# Patient Record
Sex: Female | Born: 1975 | Hispanic: No | Marital: Married | Smoking: Never smoker
Health system: Southern US, Community
[De-identification: ages and names within clinical notes are randomized; demographics above are authoritative.]

## PROBLEM LIST (undated history)

## (undated) DIAGNOSIS — Z9889 Other specified postprocedural states: Secondary | ICD-10-CM

## (undated) DIAGNOSIS — N820 Vesicovaginal fistula: Secondary | ICD-10-CM

## (undated) HISTORY — DX: Vesicovaginal fistula: N82.0

## (undated) HISTORY — PX: MOLE REMOVAL: SHX2046

## (undated) HISTORY — DX: Other specified postprocedural states: Z98.890

## (undated) HISTORY — PX: VESICOVAGINAL FISTULA REPAIR: SHX320

---

## 2001-01-12 ENCOUNTER — Other Ambulatory Visit: Admission: RE | Admit: 2001-01-12 | Discharge: 2001-01-12 | Payer: Self-pay | Admitting: Gynecology

## 2002-01-11 ENCOUNTER — Other Ambulatory Visit: Admission: RE | Admit: 2002-01-11 | Discharge: 2002-01-11 | Payer: Self-pay | Admitting: Gynecology

## 2003-01-24 ENCOUNTER — Other Ambulatory Visit: Admission: RE | Admit: 2003-01-24 | Discharge: 2003-01-24 | Payer: Self-pay | Admitting: Gynecology

## 2004-01-30 ENCOUNTER — Other Ambulatory Visit: Admission: RE | Admit: 2004-01-30 | Discharge: 2004-01-30 | Payer: Self-pay | Admitting: Gynecology

## 2005-02-04 ENCOUNTER — Other Ambulatory Visit: Admission: RE | Admit: 2005-02-04 | Discharge: 2005-02-04 | Payer: Self-pay | Admitting: Gynecology

## 2005-10-09 ENCOUNTER — Other Ambulatory Visit: Admission: RE | Admit: 2005-10-09 | Discharge: 2005-10-09 | Payer: Self-pay | Admitting: Gynecology

## 2006-04-22 ENCOUNTER — Inpatient Hospital Stay (HOSPITAL_COMMUNITY): Admission: AD | Admit: 2006-04-22 | Discharge: 2006-04-24 | Payer: Self-pay | Admitting: Gynecology

## 2006-06-03 ENCOUNTER — Other Ambulatory Visit: Admission: RE | Admit: 2006-06-03 | Discharge: 2006-06-03 | Payer: Self-pay | Admitting: Gynecology

## 2007-06-05 ENCOUNTER — Other Ambulatory Visit: Admission: RE | Admit: 2007-06-05 | Discharge: 2007-06-05 | Payer: Self-pay | Admitting: Gynecology

## 2008-05-05 ENCOUNTER — Inpatient Hospital Stay (HOSPITAL_COMMUNITY): Admission: AD | Admit: 2008-05-05 | Discharge: 2008-05-09 | Payer: Self-pay | Admitting: Obstetrics & Gynecology

## 2008-05-06 ENCOUNTER — Encounter (INDEPENDENT_AMBULATORY_CARE_PROVIDER_SITE_OTHER): Payer: Self-pay | Admitting: Obstetrics & Gynecology

## 2008-05-17 ENCOUNTER — Ambulatory Visit (HOSPITAL_COMMUNITY): Admission: RE | Admit: 2008-05-17 | Discharge: 2008-05-17 | Payer: Self-pay | Admitting: Urology

## 2008-08-02 ENCOUNTER — Ambulatory Visit (HOSPITAL_COMMUNITY): Admission: RE | Admit: 2008-08-02 | Discharge: 2008-08-03 | Payer: Self-pay | Admitting: Urology

## 2008-08-02 ENCOUNTER — Encounter (INDEPENDENT_AMBULATORY_CARE_PROVIDER_SITE_OTHER): Payer: Self-pay | Admitting: Urology

## 2008-08-23 ENCOUNTER — Ambulatory Visit (HOSPITAL_COMMUNITY): Admission: RE | Admit: 2008-08-23 | Discharge: 2008-08-23 | Payer: Self-pay | Admitting: Urology

## 2008-10-25 ENCOUNTER — Ambulatory Visit: Payer: Self-pay | Admitting: Gynecology

## 2008-10-26 ENCOUNTER — Ambulatory Visit: Payer: Self-pay | Admitting: Gynecology

## 2008-11-24 ENCOUNTER — Ambulatory Visit: Payer: Self-pay | Admitting: Gynecology

## 2009-08-09 ENCOUNTER — Other Ambulatory Visit: Admission: RE | Admit: 2009-08-09 | Discharge: 2009-08-09 | Payer: Self-pay | Admitting: Gynecology

## 2009-08-09 ENCOUNTER — Encounter: Payer: Self-pay | Admitting: Gynecology

## 2009-08-09 ENCOUNTER — Ambulatory Visit: Payer: Self-pay | Admitting: Gynecology

## 2009-08-10 ENCOUNTER — Ambulatory Visit: Payer: Self-pay | Admitting: Gynecology

## 2009-09-13 ENCOUNTER — Ambulatory Visit: Payer: Self-pay | Admitting: Gynecology

## 2010-01-05 ENCOUNTER — Ambulatory Visit: Payer: Self-pay | Admitting: Gynecology

## 2010-02-01 ENCOUNTER — Ambulatory Visit: Payer: Self-pay | Admitting: Gynecology

## 2010-05-08 ENCOUNTER — Ambulatory Visit: Payer: Self-pay | Admitting: Gynecology

## 2010-08-13 ENCOUNTER — Ambulatory Visit: Payer: Self-pay | Admitting: Gynecology

## 2010-08-13 ENCOUNTER — Other Ambulatory Visit: Admission: RE | Admit: 2010-08-13 | Discharge: 2010-08-13 | Payer: Self-pay | Admitting: Gynecology

## 2010-09-26 ENCOUNTER — Ambulatory Visit: Payer: Self-pay | Admitting: Women's Health

## 2011-01-06 ENCOUNTER — Encounter: Payer: Self-pay | Admitting: Urology

## 2011-01-07 ENCOUNTER — Encounter: Payer: Self-pay | Admitting: Urology

## 2011-04-30 NOTE — Consult Note (Signed)
[name], [name]           ACCOUNT NO.:  [id number]   MEDICAL RECORD NO.:  [id number]          PATIENT TYPE:  OUT   LOCATION:  XRAY                         FACILITY:  [facility]   PHYSICIAN:  Courtney Paris, M.D.DATE OF BIRTH:  February 20, 1976   DATE OF CONSULTATION:  05/06/2008  DATE OF DISCHARGE:                                 CONSULTATION   REQUESTING PHYSICIAN:  Dineen Kid. Rana Snare, MD   REASON FOR CONSULTATION:  Left hydronephrosis and bladder injury.   BRIEF HISTORY:  The patient is a 35 year old white female who underwent  a C-section this morning with her second child, a little girl, and  during the C-section, the bladder was inadvertently entered.  This was  closed primarily and postoperatively a CT scan showed a left  hydronephrosis that was rather marked and with delayed function of the  left kidney.  Very little if any dye was getting into the ureter with  the delayed films.   The patient does not have pain in the left flank, just in site of the  Pfannenstiel incision.  She has not had any urinary problems in the  past.   ALLERGIES:  She has no allergies.   MEDICATIONS:  No medications.   REVIEW OF SYSTEMS:  Generally unremarkable for 12 systems.   FAMILY HISTORY AND SOCIAL HISTORY:  Unremarkable.  She has one other  child.   PHYSICAL EXAMINATION:  VITAL SIGNS:  Normal.  GENERAL:  She is a pleasant white female lying quietly in bed.  No acute  distress.  HEENT:  Clear.  NECK:  Supple.  ABDOMEN:  Soft with no CVA tenderness that can be elicited.  She has a  recent Pfannenstiel scar with a dressing over it.  EXTREMITIES:  Negative.  No edema.  Good distal pulses.   The CT scan was reviewed and I agree that there was marked  hydronephrosis of the left kidney with delayed function noted.   The best course would be to do a percutaneous left nephrostomy today at  [facility] through interventional radiology, this has already been  ordered.  With her recent  surgery with the bladder repair and the lack  of ureteroscopic equipment at [facility], a left percutaneous  nephrostomy will be the safest and most conservative at this point.  She  could go home with that and next week also through interventional  radiology, a  antegrade stent placement could be attempted and removal of the  percutaneous nephrostomy at that time.  This would give time for the  injury to hopefully heal and open up and then I could manage the stent  cystoscopically later.   I will be glad to follow the patient with you.      Courtney Paris, M.D.  Electronically Signed     HMK/MEDQ  D:  05/06/2008  T:  05/07/2008  Job:  [id number]

## 2011-04-30 NOTE — Op Note (Signed)
NAME:  [name], [name] NO.:  [id number]   MEDICAL RECORD NO.:  [id number]          PATIENT TYPE:  INP   LOCATION:                                FACILITY:  [facility]   PHYSICIAN:  Freddy Finner, M.D.   DATE OF BIRTH:  1976/11/28   DATE OF PROCEDURE:  DATE OF DISCHARGE:                               OPERATIVE REPORT   PREOPERATIVE DIAGNOSES:  Intrauterine pregnancy at term, spontaneous  onset of labor, intrapartum fetal distress, and failure to progress in  labor with failure of descent in second stage and persistent occipital  posterior presentation.   POSTOPERATIVE DIAGNOSES:  Delivery of viable female infant, Apgars of 8  and 9 signed by NICU and attendance and arterial cord pH of 7.91.   ANESTHESIA:  Epidural which was placed during labor.   ESTIMATED INTRAOPERATIVE BLOOD LOSS:  1000 mL.   INTRAOPERATIVE COMPLICATIONS:  Cystotomy and vaginal laceration, both  adequately repaired.   SURGEON:  Freddy Finner, MD.   ASSISTANT:  Dineen Kid. Rana Snare, MD.   The patient is a 35 year old gravida 2, para 1, who presented in labor.  She progressed to complete pushing for 1-1/2 hours.  Examination at that  time did reveal that the cervix to be completely dilated.  The vertex  was at a -1 station with bulging.  The fetal heart tracing showed deep  variable decelerations with decrease in beat-to-beat variability.  These  decelerations have being on for approximately 45 minutes before  discovered them when I was in the room to check the patient's due to her  failure to progress in stage II of labor.  A decision was then made to  proceed with cesarean, and she was brought to the operating room  quickly.  There, she was placed in dorsal recumbent position.  The  abdomen was prepped and draped in the usual fashion.  Foley was  indwelling.  Sterile drapes were applied.  The lower abdominal  transverse incision was made and carried sharply down to fascia.  Fascia  was entered  sharply and extended to the external skin incision.  Rectus  sheath was developed superiorly and inferiorly with blunt and sharp  dissection.  Subcutaneous vessels were controlled with the Bovie.  Rectus muscles were divided in the midline, but was thought to be in the  peritoneum and was elevated at the upper portion of the rectus incision  which was high up on the anterior abdominal wall near the umbilicus.  On  entry at this level, peritoneum was not identified.  Continued blunt and  sharp dissection was carried out, and ultimately the bladder was entered  and Foley bulb was identified.  The bladder was further dissected off of  the cervix in the lower uterine segment and a transverse incision made.  It turns out that this incision was actually in the anterior vaginal  wall at the fourchette.  The antrum was then delivered without  significant difficulty through this incision.  Apgars and arterial cord  pH were noted above.  There was after coming meconium, but none in the  fluid that was visible  before delivery or at delivery of the head.  The  placenta was manually removed after collecting cord blood sample both  arterial and venous.  The uterus was delivered onto the anterior  abdominal wall.  Very careful examination revealed what appeared to be a  midline type incision in the dome of the bladder.  The cervix was fully  identified.  The vagina had been entered right at the vaginal fornix  anteriorly.  Posterior vagina was still intact.  Manual exploration of  the vagina confirmed these findings.  The bladder defect was closed in a  double layer with running 0 chromic for the first layer and a second  imbricating type suture of 0 chromic.  Vaginal edges were carefully  identified.  The angle on the right side was initially sutured running  from that angle to approximately the midportion in the vaginal incision.  The other vaginal angle was also identified and suture run back to the   suture in the midline from the other side.  This effectively closed the  vagina and repositioned the cervix.  This was confirmed again by vaginal  exploration digitally.  Irrigation was carried out.  Hemostasis was  complete.  The uterus, tubes, and ovaries were normal.  There was a  suggestion of a liver irregularity perhaps representing some small  intramural myomas, but no definite large nodules were noted.  The uterus  was delivered back into the abdominal cavity.  Irrigation was carried  out.  The abdominal incision was then closed in layers.  A running 0  Monocryl was used to close the peritoneum and reapproximate the rectus  muscles.  Fascia was closed with double looped 0 PDS suture.  Skin was  closed with broad skin staples and quarter inch Steri-Strips.  The  patient was taken to the recovery room in good condition.  A CT scan is  to be performed immediately postop for confirmation of ureteral  integrity.  I do note that one ureter must be functioning because she  was given indigo carmine IV which was found in the bladder before  complete closure and in the Foley bag.      Freddy Finner, M.D.  Electronically Signed     WRN/MEDQ  D:  05/06/2008  T:  05/06/2008  Job:  [id number]

## 2011-04-30 NOTE — Discharge Summary (Signed)
NAMESMT, LOKEY           ACCOUNT NO.:  [id number]   MEDICAL RECORD NO.:  [id number]          PATIENT TYPE:  INP   LOCATION:  9123                          FACILITY:  [facility]   PHYSICIAN:  Freddy Finner, M.D.   DATE OF BIRTH:  11-10-1976   DATE OF ADMISSION:  05/05/2008  DATE OF DISCHARGE:  05/06/2008                               DISCHARGE SUMMARY   DISCHARGE DIAGNOSES:  1. Intrauterine pregnancy at term.  2. Failure to progress in labor.  3. Intrapartum fetal distress.   PROCEDURE:  Primary low transverse cervical cesarean section with  intraoperative cystotomy and vaginal laceration requiring operative  repair.   COMPLICATIONS:  Obstruction of left ureter.   SECONDARY PROCEDURE:  Transcutaneous left nephrostomy.   OTHER COMPLICATIONS:  None.   DISPOSITION:  The patient is in satisfactory condition at the time of  her discharge.  She is to have progressively increasing physical  activity.  She is discharged home with an indwelling Foley catheter  which we will continue for another week.  She has a nephrostomy on the  left which we will continue until further urological intervention by  Courtney Paris, M.D.  She is to take her regular diet.   DISCHARGE MEDICATIONS:  1. Percocet 5/325 to be taken one or two every 3-4 hours as needed for      pain.  2. Motrin 600 mg to be taken one every 6 hours as needed for pain.  3. Continue Keflex 500 mg t.i.d.  4. Ferralet to be taken b.i.d. with meals for anemia.   She is instructed to call for heavy vaginal bleeding, for any difficulty  with emptying of the bladder or the nephrostomy, or for fever.  She is  instructed to return to [facility] for any condition that she  considers urgent.  She will be followed in our office in approximately 2  weeks for incision check and arrangements with Dr. Aldean Ast are pending  at the time of discharge.   HISTORY OF PRESENT ILLNESS:   PAST MEDICAL HISTORY:   FAMILY  HISTORY:   REVIEW OF SYSTEMS:   PHYSICAL EXAMINATION:  Details are recorded in the admission note and/or  in the prenatal summary.   The patient presented in active labor and had failure to progress in the  active phase of labor.   LABORATORY DATA:  CT of the abdomen which showed obstruction of the left  ovary in the immediate postoperative period.   Normal CBC on admission with hemoglobin of 11.9.  Serum electrolytes on  the day of admission were normal as was the prothrombin time and PTT.  Her first postoperative hemoglobin was 11.9 on the day of surgery and  8.3 later on the day of surgery and 6.5 on the morning following  surgery.  It reached a low of 5.9 where it had stabilized.   HOSPITAL COURSE:  Following admission the patient progressed fairly  rapidly to 9 cm of dilatation with a long anterior cervical lip.  This  was her examination at the time of my first examination at approximately  4 a.m. on the day  of admission.  Approximately 40 minutes later the  cervix by patient report and nurse examination was unchanged.  The next  time I was called to see the patient approximately 2 hours later  revealed the fetal heart rate tracing did show severe variables with  each contraction and decreased beat to beat variability.  On  examination, her cervix was considered to be complete.  There was  molding and a -1 station.  There was a question of fetal presentation  with what clinically seemed to be right occipitoposterior.  Given the  whole constellation of the situation plus the fact that she had been  pushing for an hour and a half, I elected to proceed with cesarean  delivery.  There were complications experienced during that surgery, but  the defect created in the bladder and vagina was adequately repaired.  Because of the advanced cervical dilatation and need to be certain about  ureters, it was elected to perform a CT scan immediately after the  delivery which did show  obstruction of the left ureter.  Dr. Aldean Ast  was consulted.  Due to the marked dilatation of the left collecting  system, the chosen plan of treatment was a left percutaneous nephrostomy  done by the interventional radiologist at [facility].  This apparently went without significant difficulty or  complications.  The patient has been followed for an additional 3 days  from that time and is clinically in satisfactory condition.  She has  been kept on antibiotics and is currently taking oral Keflex which will  be continued at home.  She has remained afebrile throughout.  Inspite of  her low hemoglobin she has no orthostatic falling pressures or  significant pulse rate elevation.  She is asymptomatic from this.  Instructions have been gone over at length with her including to return  to the hospital in any event that seems troublesome.  My office will  arrange continued follow-up with planned placement of a urethral stent  in the near future with Dr. Aldean Ast.      Freddy Finner, M.D.  Electronically Signed     WRN/MEDQ  D:  05/09/2008  T:  05/09/2008  Job:  [id number]

## 2011-04-30 NOTE — Op Note (Signed)
[name], [name]           ACCOUNT NO.:  [id number]   MEDICAL RECORD NO.:  [id number]          PATIENT TYPE:  OIB   LOCATION:  0098                         [facility]AC[facility]   PHYSICIAN:  Martina Sinner, MD DATE OF BIRTH:  10-22-1976   DATE OF PROCEDURE:  08/02/2008  DATE OF DISCHARGE:                               OPERATIVE REPORT   SURGEON ASSISTANT:  Dr. Delman Kitten.   PREOPERATIVE DIAGNOSIS:  Vesicovaginal fistula.   POSTOPERATIVE DIAGNOSIS:  Vesicovaginal fistula.   SURGERY:  1. Repair of vesicovaginal fistula x2 (plus interposition of graft).  2. Cystoscopy.  3. Bilateral insertion of open ureteral catheters.   [name] had an emergency cesarean section approximately 2 months  ago and had a bladder injury and a subsequent left ureteral injury.  She  went on to have a percutaneous tube and an antegrade stent and actually  has done well with no flank pain and has no hydronephrosis on the left  side.   The patient was prepped and draped in the usual fashion.  She was given  appropriate antibiotics for a positive culture that was asymptomatic  prior to surgery.  She is breast feeding.  She has left enough breast  milk by pumping for approximately 5 days.   She was placed in lithotomy position.  Extra care was taken to minimize  the risk of compartment syndrome, neuropathy and DVT.  She had good  vaginal length.  The cervix was easily identified.  She had 1  vesicovaginal fistula with granulation tissue identified along the  anterior vaginal wall approximately a centimeter from the apex in the  midline anteriorly.   I cystoscoped the patient.  I identified both ureteral orifices.  I used  a sensor wire to cystoscopically pass an open-end ureteral catheter to  each kidney bilaterally.  Throughout the case they had good output and  there was no blood.   Transvaginally, I placed a 5-French Fogarty balloon and instilled 1.5  mL.  I then identified a second  hole and placed another 5-French Fogarty  balloon and it was to the left of the first Fogarty.  Both fistula were  in the midline 0.5 cm and arguably even a little bit further from the  ureteral orifice.  She had scar tissue with what one might call ripples  of mucosa near the left ureteral orifice and I probed this several times  with a sensor wire and there was no fistula.  In my opinion, she had 2  fistula and they were away from the ureteral orifice.  For this reason,  I proceeded with a transvaginal repair.   A Foley catheter was inserted.  A curved cerebellar retractor, weighted  vaginal speculum and the double ring retractor was eventually used for  exposure.  I placed six 3-0 silk pop-off sutures around both fistula  approximately a few millimeters from the track.  I then marked out an  inverted shaped wide base vaginal wall flap that extended from the tip  of the 2 fistula towards the urethra.  Vaginal wall flap was easily  mobilized, leaving very mobile thick pubocervical  fascia with the  patient.  I then circumscribed the rest of the fistula tract towards the  cervix and laterally and then mobilized the vaginal wall again for at  least a centimeter and half or more than 2 cm laterally.   At this stage both fistulas were identified by their stay sutures and  Fogarty balloons and I was very happy with the circumferential vaginal  wall mobilization.  At this stage, I first closed the fistula to the  patient's right.  I excised some of the fistula tract with Strulle  scissors.  I then closed the fistula with running 3-0 Vicryl overlapping  the apex on the left and right several millimeters.  I then did a second  layer Lembert closure.  The exact same technique closed the fistula on  the left.  The fistula on the left was approximately 6-8 mm in length.  The fistula on the right was probably 2 mm in size.  Fortunately, she  had a very thick bladder wall with very mobile  pubocervical fascia,  allowing a nice thick first layer followed by a Lembert second layer not  under tension.  I then sutured with 3-0 chromic a 3 cm x 2 cm SurgiSis  graft over the repair.   There was very little vaginal wall to trim at the level of the cervix so  I trimmed approximately 7 or 8 mm of the tip of the vaginal wall flap I  initially made at the beginning of the case.  I reapproximated the flap  over the repair, trying not to overlap any suture lines.  I used running  3-0 Vicryl.  The cervix was easily identified.   Throughout the case, copious irrigation was utilized and gentamicin had  been added to the irrigation.  Leg position was good.   I easily removed both ureteral catheters.  There was blue urine exiting  the bladder from the Foley catheter after giving indigo carmine IV.  I  did not cystoscope the patient, since I did not want to overfill the  bladder and jeopardize the repair.  I had filled the bladder a little  bit with saline before I closed the vaginal wall and there was no leak.  The repair was cephalad to the ureteral orifices and all the suturing  imbricated in a cephalad to caudal direction and not left-to-right  direction.   I was very pleased with the operation.  A vaginal pack with Estrace  cream was inserted.  I am very hopeful that this will reach her  treatment goal.  She will be treated with appropriate antibiotics and  anticholinergic medications for bladder spasms post procedure.           ______________________________  Martina Sinner, MD  Electronically Signed     SAM/MEDQ  D:  08/02/2008  T:  08/02/2008  Job:  [id number]

## 2011-09-11 LAB — CBC
HCT: 15.7 — ABNORMAL LOW
HCT: 15.9 — ABNORMAL LOW
HCT: 16.9 — ABNORMAL LOW
HCT: 18.6 — ABNORMAL LOW
Hemoglobin: 11.9 — ABNORMAL LOW
Hemoglobin: 5.5 — CL
Hemoglobin: 8.3 — ABNORMAL LOW
MCHC: 34.8
MCHC: 35.1
MCHC: 35.2
MCHC: 35.3
MCV: 93.1
MCV: 93.2
MCV: 93.6
MCV: 94.1
Platelets: 191
Platelets: 228
RBC: 1.67 — ABNORMAL LOW
RBC: 1.98 — ABNORMAL LOW
RBC: 2.51 — ABNORMAL LOW
RBC: 3.65 — ABNORMAL LOW
RDW: 12.9
RDW: 13.1
RDW: 13.2
WBC: 10.5
WBC: 12.6 — ABNORMAL HIGH
WBC: 20.3 — ABNORMAL HIGH
WBC: 8.5
WBC: 8.9

## 2011-09-11 LAB — BASIC METABOLIC PANEL
BUN: 7
CO2: 25
Chloride: 102
Creatinine, Ser: 1.01
Potassium: 4

## 2011-09-11 LAB — PROTIME-INR
INR: 0.9
INR: 1.1

## 2011-09-11 LAB — COMPREHENSIVE METABOLIC PANEL
ALT: 17
AST: 27
Albumin: 2 — ABNORMAL LOW
Alkaline Phosphatase: 63
BUN: 6
Chloride: 108
GFR calc Af Amer: >60
Potassium: 3.2 — ABNORMAL LOW
Sodium: 139
Total Bilirubin: 0.6
Total Protein: 4.8 — ABNORMAL LOW

## 2011-09-20 ENCOUNTER — Ambulatory Visit (INDEPENDENT_AMBULATORY_CARE_PROVIDER_SITE_OTHER): Payer: BC Managed Care – PPO | Admitting: Gynecology

## 2011-09-20 ENCOUNTER — Telehealth: Payer: Self-pay | Admitting: *Deleted

## 2011-09-20 ENCOUNTER — Other Ambulatory Visit: Payer: Self-pay | Admitting: *Deleted

## 2011-09-20 DIAGNOSIS — N390 Urinary tract infection, site not specified: Secondary | ICD-10-CM

## 2011-09-20 DIAGNOSIS — R823 Hemoglobinuria: Secondary | ICD-10-CM

## 2011-09-20 MED ORDER — NITROFURANTOIN MONOHYD MACRO 100 MG PO CAPS: ORAL_CAPSULE | ORAL | Status: DC

## 2011-09-20 NOTE — Telephone Encounter (Signed)
Message copied by Aura Camps on Fri Sep 20, 2011  4:14 PM ------      Message from: Trellis Paganini      Created: Fri Sep 20, 2011  4:07 PM       Treat patient with Macrobid twice a day with food for 4 days. Ask her to check back with [place] next week regarding culture results and how she is doing.

## 2011-09-20 NOTE — Telephone Encounter (Signed)
Pt calling c/o uti s/s x 1 day lower back pain and frequent urination. Pt wants to know if she could drop off a u/a she will be leaving town this afternoon. Pt has annual scheduled 10/03/11. Please advise

## 2011-09-20 NOTE — Telephone Encounter (Signed)
Pt informed with the below note, rx  Sent to pharmacy.

## 2011-10-03 ENCOUNTER — Encounter: Payer: Self-pay | Admitting: Gynecology

## 2011-10-07 ENCOUNTER — Other Ambulatory Visit: Payer: Self-pay | Admitting: Dermatology

## 2011-10-25 ENCOUNTER — Encounter: Payer: Self-pay | Admitting: Gynecology

## 2011-10-25 ENCOUNTER — Ambulatory Visit (INDEPENDENT_AMBULATORY_CARE_PROVIDER_SITE_OTHER): Payer: BC Managed Care – PPO | Admitting: Gynecology

## 2011-10-25 VITALS — BP 116/74 | Ht 66.0 in | Wt 135.0 lb

## 2011-10-25 DIAGNOSIS — Z01419 Encounter for gynecological examination (general) (routine) without abnormal findings: Secondary | ICD-10-CM

## 2011-10-25 DIAGNOSIS — B373 Candidiasis of vulva and vagina: Secondary | ICD-10-CM

## 2011-10-25 DIAGNOSIS — R823 Hemoglobinuria: Secondary | ICD-10-CM

## 2011-10-25 DIAGNOSIS — N898 Other specified noninflammatory disorders of vagina: Secondary | ICD-10-CM

## 2011-10-25 DIAGNOSIS — B3731 Acute candidiasis of vulva and vagina: Secondary | ICD-10-CM

## 2011-10-25 DIAGNOSIS — R351 Nocturia: Secondary | ICD-10-CM

## 2011-10-25 DIAGNOSIS — Z131 Encounter for screening for diabetes mellitus: Secondary | ICD-10-CM

## 2011-10-25 DIAGNOSIS — Z13220 Encounter for screening for lipoid disorders: Secondary | ICD-10-CM

## 2011-10-25 MED ORDER — FLUCONAZOLE 150 MG PO TABS: 150.0000 mg | ORAL_TABLET | Freq: Once | ORAL | Status: AC

## 2011-10-25 NOTE — Progress Notes (Signed)
[name] 09-30-1976 [id number]        35 y.o.  for annual exam.  Several issues as noted below  Past medical history,surgical history, medications, allergies, family history and social history were all reviewed and documented in the EPIC chart. ROS:  Was performed and pertinent positives and negatives are included in the history.  Exam: chaperone present Filed Vitals:   10/25/11 1524  BP: 116/74   General appearance  Normal Skin grossly normal Head/Neck normal with no cervical or supraclavicular adenopathy thyroid normal Lungs  clear Cardiac RR, without RMG Abdominal  soft, nontender, without masses, organomegaly or hernia Breasts  examined lying and sitting without masses, retractions, discharge or axillary adenopathy. Pelvic  Ext/BUS/vagina  normal with white discharge KOH wet prep done  Cervix  normal  IUD string not visualized  Uterus  anteverted, normal size, shape and contour, midline and mobile nontender   Adnexa  Without masses or tenderness    Anus and perineum  normal   Rectovaginal  normal sphincter tone without palpated masses or tenderness.    Assessment/Plan:  34 y.o. female for annual exam.    1. White discharge. Patient notes a little discharge KOH wet prep is positive for yeast. We'll treat with Diflucan 150x1 dose follow up if symptoms persist or recur. 2. History this vesicovaginal fistula following cesarean delivery. 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 She had a prior repair in 2009. Her urine today looks like a low-grade UTI. I am going to wait for culture to treat it she knows to follow up for this.  She does note some nocturia for last several months. I discussed it may be due to a low-grade UTI but also issues of fluid restriction decrease caffeine after dinner was also discussed with her. 3. IUD management. Patient has Mirena IUD doing well with this placed November 2009. I did not see  the string as I have not in the past noting ultrasound documentation of normal position in the past and nothing is changed from a menstrual history standpoint and she's comfortable with observation. 4. Health maintenance. SBE monthly reviewed. Screening mammogram recommendations between 35 and 40 were reviewed with her. She has no family history prefers to wait closer to 40. She has no history of abnormal Pap smears in the past and has had annual regular Pap smears documented in her chart the last in 2011. I did not do a Pap smear today we'll plan an every 3 year screening. Baseline labs of CBC glucose lipid profile were done. Assuming she continues well from a gynecologic standpoint and she will see [place] in a year sooner as needed.    Dara Lords MD, 4:11 PM 10/25/2011

## 2011-10-28 MED ORDER — SULFAMETHOXAZOLE-TMP DS 800-160 MG PO TABS: 1.0000 | ORAL_TABLET | Freq: Two times a day (BID) | ORAL | Status: AC

## 2011-10-28 NOTE — Progress Notes (Signed)
Addended by: Dara Lords on: 10/28/2011 02:03 PM   Modules accepted: Orders

## 2012-02-25 ENCOUNTER — Encounter: Payer: Self-pay | Admitting: Gynecology

## 2012-02-25 ENCOUNTER — Ambulatory Visit (INDEPENDENT_AMBULATORY_CARE_PROVIDER_SITE_OTHER): Payer: BC Managed Care – PPO | Admitting: Gynecology

## 2012-02-25 VITALS — BP 120/78

## 2012-02-25 DIAGNOSIS — Z960 Presence of urogenital implants: Secondary | ICD-10-CM

## 2012-02-25 DIAGNOSIS — N820 Vesicovaginal fistula: Secondary | ICD-10-CM | POA: Insufficient documentation

## 2012-02-25 DIAGNOSIS — R3915 Urgency of urination: Secondary | ICD-10-CM

## 2012-02-25 DIAGNOSIS — M549 Dorsalgia, unspecified: Secondary | ICD-10-CM

## 2012-02-25 DIAGNOSIS — N821 Other female urinary-genital tract fistulae: Secondary | ICD-10-CM

## 2012-02-25 HISTORY — DX: Vesicovaginal fistula: N82.0

## 2012-02-25 LAB — URINALYSIS W MICROSCOPIC + REFLEX CULTURE
Casts: NONE SEEN
Crystals: NONE SEEN
Leukocytes, UA: NEGATIVE
Nitrite: NEGATIVE
Specific Gravity, Urine: 1.015 (ref 1.005–1.030)
pH: 7 (ref 5.0–8.0)

## 2012-02-25 NOTE — Patient Instructions (Signed)

## 2012-02-25 NOTE — Progress Notes (Signed)
Patient presented to the office today with a complaint of urinary frequency but not completely cramping. She denies any fever chills nausea or vomiting. She has had some lower back especially to her left flank area discomfort. She denied any vaginal discharge. And no true dysuria. Patient has an IUD for contraception. Patient states her cycles are very light if any. She is currently taking ampicillin 250 mg twice a day that her dermatologist it was placed her on for acne.  On further questioning patient stated that her last delivery at time of C-section it at another facility she sustained a urological injury whereby she has a left ureteral stent and also has had a vesicovaginal fistula repaired. She had been followed at [facility] urology department as well as with Dr. Patsi Sears here at [facility] urology. She has not followed up with urologist in over year.  Exam: Back: Slight tenderness in the left flank area but no guarding. Abdomen: Soft nontender no rebound or guarding Pelvic: Bartholin urethra Skene was within normal limits Vagina: No lesions or discharge Cervix: No lesions or discharge  Uterus: Anteverted normal size shape and consistency with no palpable masses or tenderness Adnexa: No palpable masses or tenderness Rectal: Not examined  Urinalysis trace hemoglobin (0-2 RBC) rare bacteria will submitted for urine culture and sensitivity.  Assessment/plan: Patient with symptom of her urinary tract infection although not proven on urine sample today. Patient is on ampicillin which she takes twice a day for acne. She may have partially treated herself if the organism is sensitive to ampicillin. We'll run a call culture and sensitivity today and have her stay on the current antibiotic and we will call her next 48 hours as soon as we get a culture back. I have given her a sample Uribell antispasmodic agent to take 1 tablet 4 times a day for the next 2 days and to increase her  fluid intake. I did discuss with her and recommended she followup with Dr. Patsi Sears (urologist) for followup on her ureteral stent in the event that the discomfort in her back may be attributed to displacement of the ureteral stent.

## 2012-02-26 LAB — URINE CULTURE: Colony Count: NO GROWTH

## 2012-07-13 ENCOUNTER — Other Ambulatory Visit: Payer: Self-pay | Admitting: Dermatology

## 2012-07-28 ENCOUNTER — Other Ambulatory Visit: Payer: Self-pay | Admitting: Dermatology

## 2012-10-29 ENCOUNTER — Ambulatory Visit (INDEPENDENT_AMBULATORY_CARE_PROVIDER_SITE_OTHER): Payer: BC Managed Care – PPO | Admitting: Gynecology

## 2012-10-29 ENCOUNTER — Encounter: Payer: Self-pay | Admitting: Gynecology

## 2012-10-29 VITALS — BP 110/70 | Ht 66.75 in | Wt 140.0 lb

## 2012-10-29 DIAGNOSIS — Z01419 Encounter for gynecological examination (general) (routine) without abnormal findings: Secondary | ICD-10-CM

## 2012-10-29 NOTE — Patient Instructions (Signed)
Follow up in one year for annual gynecologic exam. Your Mirena IUD will need to be replaced next year.

## 2012-10-29 NOTE — Progress Notes (Signed)
[name] [birth year] [id number]        36 y.o.  G2P2 for annual exam.    Past medical history,surgical history, medications, allergies, family history and social history were all reviewed and documented in the EPIC chart. ROS:  Was performed and pertinent positives and negatives are included in the history.  Exam: Biomedical scientist Filed Vitals:   10/29/12 1552  BP: 110/70  Height: 5' 6.75" (1.695 m)  Weight: 140 lb (63.504 kg)   General appearance  Normal Skin grossly normal Head/Neck normal with no cervical or supraclavicular adenopathy thyroid normal Lungs  clear Cardiac RR, without RMG Abdominal  soft, nontender, without masses, organomegaly or hernia Breasts  examined lying and sitting without masses, retractions, discharge or axillary adenopathy. Pelvic  Ext/BUS/vagina  Mild mid to upper midline vaginal scarring palpated consistent with past history of vesicovaginal fistula repair   Cervix  normal IUD string not visualized  Uterus  anteverted, normal size, shape and contour, midline and mobile nontender   Adnexa  Without masses or tenderness    Anus and perineum  normal   Rectovaginal  normal sphincter tone without palpated masses or tenderness.    Assessment/Plan:  36 y.o. G2P2 female for annual exam.   1. Mirena IUD 10/2008. Patient doing well amenorrheic. IUD string was not visualized as in the past and has been documented with ultrasound previously. We'll continue to follow. Patient knows that this will need to be replaced next year. 2. Pap smear. No Pap smear done today. Last Pap smear 2011.  No history of abnormal Pap smears previously. Plan repeat next year at 3 year interval 3. Mammogram. Reviewed screening mammographic recommendations between 35 and 40. She is low risk and prefers to wait closer to 40. SBE monthly reviewed. 4. Health maintenance. Just recently saw her primary where blood work was done. Follow up one year, sooner as  needed.    Dara Lords MD, 4:30 PM 10/29/2012

## 2012-10-30 ENCOUNTER — Encounter: Payer: Self-pay | Admitting: Gynecology

## 2012-10-30 LAB — URINALYSIS W MICROSCOPIC + REFLEX CULTURE
Bilirubin Urine: NEGATIVE
Crystals: NONE SEEN
Glucose, UA: NEGATIVE mg/dL
Specific Gravity, Urine: 1.006 (ref 1.005–1.030)
Squamous Epithelial / LPF: NONE SEEN
Urobilinogen, UA: 0.2 mg/dL (ref 0.0–1.0)

## 2012-11-11 ENCOUNTER — Other Ambulatory Visit: Payer: Self-pay | Admitting: Dermatology

## 2012-12-07 ENCOUNTER — Other Ambulatory Visit: Payer: Self-pay | Admitting: Dermatology

## 2013-12-01 ENCOUNTER — Other Ambulatory Visit (HOSPITAL_COMMUNITY)
Admission: RE | Admit: 2013-12-01 | Discharge: 2013-12-01 | Disposition: A | Discharge status: Home / Self Care | Payer: BC Managed Care – PPO | Source: Ambulatory Visit | Attending: Gynecology | Admitting: Gynecology

## 2013-12-01 ENCOUNTER — Ambulatory Visit (INDEPENDENT_AMBULATORY_CARE_PROVIDER_SITE_OTHER): Payer: BC Managed Care – PPO | Admitting: Gynecology

## 2013-12-01 ENCOUNTER — Encounter: Payer: Self-pay | Admitting: Gynecology

## 2013-12-01 VITALS — BP 120/76 | Ht 68.0 in | Wt 138.0 lb

## 2013-12-01 DIAGNOSIS — Z01419 Encounter for gynecological examination (general) (routine) without abnormal findings: Secondary | ICD-10-CM

## 2013-12-01 DIAGNOSIS — Z1151 Encounter for screening for human papillomavirus (HPV): Secondary | ICD-10-CM | POA: Insufficient documentation

## 2013-12-01 DIAGNOSIS — Z30431 Encounter for routine checking of intrauterine contraceptive device: Secondary | ICD-10-CM

## 2013-12-01 NOTE — Progress Notes (Signed)
[name] 03/28/1976 [id number]        37 y.o.  G2P2 for annual exam.  Several issues noted below.  Past medical history,surgical history, problem list, medications, allergies, family history and social history were all reviewed and documented in the EPIC chart.  ROS:  Performed and pertinent positives and negatives are included in the history, assessment and plan .  Exam: Kim assistant Filed Vitals:   12/01/13 1537  BP: 120/76  Height: 5\' 8"  (1.727 m)  Weight: 138 lb (62.596 kg)   General appearance  Normal Skin grossly normal Head/Neck normal with no cervical or supraclavicular adenopathy thyroid normal Lungs  clear Cardiac RR, without RMG Abdominal  soft, nontender, without masses, organomegaly or hernia Breasts  examined lying and sitting without masses, retractions, discharge or axillary adenopathy. Pelvic  Ext/BUS/vagina  nodular scarring mid anterior vaginal wall consistent with past history of vesicovaginal fistula repair  Cervix  Normal IUD string not visualized. Pap/HPV done   Uterus anteverted , normal size, shape and contour, midline and mobile nontender   Adnexa  Without masses or tenderness    Anus and perineum  Normal   Rectovaginal  Normal sphincter tone without palpated masses or tenderness.    Assessment/Plan:  37 y.o. G2P2 female for annual exam, amenorrheic, Mirena IUD .   1.  Mirena IUD 10/2008. Due to be replaced now and patient knows to make an appointment to do so. String not visualized as in the past. Intrauterine placement previously documented on ultrasound. 2. Pap smear 2011. Pap smear/HPV done today. No history of abnormal Pap smears previously. 3. Mammographic recommendations between 35 and 40 reviewed. Patient plans scheduling end of this year she turns 38. SBE monthly reviewed. 4. Health maintenance. No blood work done today as she was planning to have it done through her primary physician's office. Followup for Mirena IUD  replacement.   Note: This document was prepared with digital dictation and possible smart phrase technology. Any transcriptional errors that result from this process are unintentional.   Dara Lords MD, 4:08 PM 12/01/2013

## 2013-12-01 NOTE — Patient Instructions (Signed)
Followup for IUD replacement appointment now. Followup routinely in one year for annual exam

## 2013-12-01 NOTE — Addendum Note (Signed)
Addended by: Dayna Barker on: 12/01/2013 04:15 PM   Modules accepted: Orders

## 2013-12-16 HISTORY — PX: INTRAUTERINE DEVICE INSERTION: SHX323

## 2013-12-17 ENCOUNTER — Other Ambulatory Visit: Payer: Self-pay | Admitting: Gynecology

## 2013-12-17 ENCOUNTER — Telehealth: Payer: Self-pay | Admitting: Gynecology

## 2013-12-17 DIAGNOSIS — Z3046 Encounter for surveillance of implantable subdermal contraceptive: Secondary | ICD-10-CM

## 2013-12-17 MED ORDER — LEVONORGESTREL 20 MCG/24HR IU IUD: INTRAUTERINE_SYSTEM | Freq: Once | INTRAUTERINE | Status: AC

## 2013-12-17 NOTE — Telephone Encounter (Signed)
12/17/13-Pt was advised today that the removal of existing Mirena & insertion of new is [facility] at [facility]. Appt set for 01/12/14 with TF/WL

## 2013-12-31 ENCOUNTER — Ambulatory Visit: Payer: BC Managed Care – PPO | Admitting: Gynecology

## 2014-01-12 ENCOUNTER — Ambulatory Visit (INDEPENDENT_AMBULATORY_CARE_PROVIDER_SITE_OTHER): Payer: BC Managed Care – PPO | Admitting: Gynecology

## 2014-01-12 ENCOUNTER — Encounter: Payer: Self-pay | Admitting: Gynecology

## 2014-01-12 DIAGNOSIS — N912 Amenorrhea, unspecified: Secondary | ICD-10-CM

## 2014-01-12 DIAGNOSIS — Z30431 Encounter for routine checking of intrauterine contraceptive device: Secondary | ICD-10-CM

## 2014-01-12 LAB — PREGNANCY, URINE: Preg Test, Ur: NEGATIVE

## 2014-01-12 NOTE — Progress Notes (Signed)
Patient presents for Mirena IUD removal and replacement. She has read through the booklet, has no contraindications and signed the consent form. It was to be replaced in November. Negative UPT now. Understands and accepts the possibility of very early pregnancy despite negative UPT.  I reviewed the removal and insertional process with her as well as the risks to include infection, either immediate or long-term, uterine perforation or migration requiring surgery to remove, other complications such as pain, hormonal side effects, infertility and possibility of failure with subsequent pregnancy.   Exam with Kim assistant Pelvic: External BUS vagina normal. Cervix normal IUD string not visible. Uterus anteverted normal size shape contour midline mobile nontender. Adnexa without masses or tenderness.  Procedure: The cervix was visualized and the cervical canal probed with a [place] forcep to ultimately grab the Mirena IUD string. The IUD was removed, shown to the patient and discarded. cleansed with Betadine, anterior lip grasped with a single-tooth tenaculum, the uterus was sounded and a Mirena IUD was placed according to manufacturer's recommendations without difficulty. The strings were trimmed. The patient tolerated well and will follow up in one month for a postinsertional check.  Lot number:  [id number]  Note: This document was prepared with digital dictation and possible smart phrase technology. Any transcriptional errors that result from this process are unintentional.  [name],[name] P MD, 4:37 PM 01/12/2014

## 2014-01-12 NOTE — Patient Instructions (Signed)
Intrauterine Device Insertion   Most often, an intrauterine device (IUD) is inserted into the uterus to prevent pregnancy. There are 2 types of IUDs available:  · Copper IUD This type of IUD creates an environment that is not favorable to sperm survival. The mechanism of action of the copper IUD is not known for certain. It can stay in place for 10 years.  · Hormone IUD This type of IUD contains the hormone progestin (synthetic progesterone). The progestin thickens the cervical mucus and prevents sperm from entering the uterus, and it also thins the uterine lining. There is no evidence that the hormone IUD prevents implantation. One hormone IUD can stay in place for up to 5 years, and a different hormone IUD can stay in place for up to 3 years.  An IUD is the most cost-effective birth control if left in place for the full duration. It may be removed at any time.  LET YOUR HEALTH CARE PROVIDER KNOW ABOUT:  · Any allergies you have.  · All medicines you are taking, including vitamins, herbs, eye drops, creams, and over-the-counter medicines.  · Previous problems you or members of your family have had with the use of anesthetics.  · Any blood disorders you have.  · Previous surgeries you have had.  · Possibility of pregnancy.  · Medical conditions you have.  RISKS AND COMPLICATIONS   Generally, intrauterine device insertion is a safe procedure. However, as with any procedure, complications can occur. Possible complications include:  · Accidental puncture (perforation) of the uterus.  · Accidental placement of the IUD either in the muscle layer of the uterus (myometrium) or outside the uterus. If this happens, the IUD can be found essentially floating around the bowels and must be taken out surgically.  · The IUD may fall out of the uterus (expulsion). This is more common in women who have recently had a child.    · Pregnancy in the fallopian tube (ectopic).  · Pelvic inflammatory disease (PID), which is infection of  the uterus and fallopian tubes. The risk of PID is slightly increased in the first 20 days after the IUD is placed, but the overall risk is still very low.  BEFORE THE PROCEDURE  · Schedule the IUD insertion for when you will have your menstrual period or right after, to make sure you are not pregnant. Placement of the IUD is better tolerated shortly after a menstrual cycle.  · You may need to take tests or be examined to make sure you are not pregnant.  · You may be required to take a pregnancy test.  · You may be required to get checked for sexually transmitted infections (STIs) prior to placement. Placing an IUD in someone who has an infection can make the infection worse.  · You may be given a pain reliever to take 1 or 2 hours before the procedure.  · An exam will be performed to determine the size and position of your uterus.  · Ask your health care provider about changing or stopping your regular medicines.  PROCEDURE   · A tool (speculum) is placed in the vagina. This allows your health care provider to see the lower part of the uterus (cervix).  · The cervix is prepped with a medicine that lowers the risk of infection.  · You may be given a medicine to numb each side of the cervix (intracervical or paracervical block). This is used to block and control any discomfort with insertion.  · A tool (  uterine sound) is inserted into the uterus to determine the length of the uterine cavity and the direction the uterus may be tilted.  · A slim instrument (IUD inserter) is inserted through the cervical canal and into your uterus.  · The IUD is placed in the uterine cavity and the insertion device is removed.  · The nylon string that is attached to the IUD and used for eventual IUD removal is trimmed. It is trimmed so that it lays high in the vagina, just outside the cervix.  AFTER THE PROCEDURE  · You may have bleeding after the procedure. This is normal. It varies from light spotting for a few days to menstrual-like  bleeding.   · You may have mild cramping.  Document Released: 07/31/2011 Document Revised: 09/22/2013 Document Reviewed: 05/23/2013  ExitCare® Patient Information ©2014 [employer].

## 2014-02-09 ENCOUNTER — Ambulatory Visit: Payer: BC Managed Care – PPO | Admitting: Gynecology

## 2014-02-15 ENCOUNTER — Ambulatory Visit: Payer: BC Managed Care – PPO | Admitting: Gynecology

## 2014-02-17 ENCOUNTER — Ambulatory Visit (INDEPENDENT_AMBULATORY_CARE_PROVIDER_SITE_OTHER): Payer: BC Managed Care – PPO | Admitting: Gynecology

## 2014-02-17 ENCOUNTER — Encounter: Payer: Self-pay | Admitting: Gynecology

## 2014-02-17 DIAGNOSIS — Z30431 Encounter for routine checking of intrauterine contraceptive device: Secondary | ICD-10-CM

## 2014-02-17 NOTE — Patient Instructions (Signed)
Followup in December 2015 for annual exam, sooner if any issues.

## 2014-02-17 NOTE — Progress Notes (Signed)
Natalie DresserShawnee Beilfuss 11/10/1976 [id number]        38 y.o.  G2P2 IUD followup has done well since placement 01/12/2014.  Past medical history,surgical history, problem list, medications, allergies, family history and social history were all reviewed and documented in the EPIC chart.  Exam: Sherrilyn RistKari assistant General appearance  Normal External BUS vagina normal with nodularity anterior vaginal wall consistent with her past history of vesicovaginal fistula repair. Cervix normal with IUD string normal length. Uterus normal size midline mobile nontender. Adnexa without masses or tenderness.  Assessment/Plan:  38 y.o. G2P2 with normal followup IUD check. Patient will followup December 2015 reshifted for annual exam, sooner if any issues.   Note: This document was prepared with digital dictation and possible smart phrase technology. Any transcriptional errors that result from this process are unintentional.   Dara LordsFONTAINE,TIMOTHY P MD, 4:21 PM 02/17/2014

## 2014-06-10 ENCOUNTER — Encounter: Payer: Self-pay | Admitting: Podiatry

## 2014-06-10 ENCOUNTER — Ambulatory Visit (INDEPENDENT_AMBULATORY_CARE_PROVIDER_SITE_OTHER): Payer: BC Managed Care – PPO | Admitting: Podiatry

## 2014-06-10 VITALS — BP 122/70 | HR 68 | Ht 67.0 in | Wt 135.0 lb

## 2014-06-10 DIAGNOSIS — M216X9 Other acquired deformities of unspecified foot: Secondary | ICD-10-CM

## 2014-06-10 DIAGNOSIS — M79606 Pain in leg, unspecified: Secondary | ICD-10-CM

## 2014-06-10 DIAGNOSIS — M7750 Other enthesopathy of unspecified foot: Secondary | ICD-10-CM

## 2014-06-10 DIAGNOSIS — M21969 Unspecified acquired deformity of unspecified lower leg: Secondary | ICD-10-CM

## 2014-06-10 DIAGNOSIS — M7742 Metatarsalgia, left foot: Secondary | ICD-10-CM | POA: Insufficient documentation

## 2014-06-10 DIAGNOSIS — M79609 Pain in unspecified limb: Secondary | ICD-10-CM

## 2014-06-10 NOTE — Progress Notes (Signed)
Subjective: 38 year old female presents complaining of pain in both feet L>R,  trender and sore, gets aching feet all the time.  Pain is at the lateral column of metatarsal bones. 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 On vacation now.   Objective: Hypermobile first ray L>R with excess sagittal plane motion with loading of forefoot. High ligamentous laxity bilateral. Neurovascular status are within normal. Excess rearfoot pronation bilateral.   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 Lesser metatarsalgia left foot. Forefoot varus with STJ hyperpronation.   Plan: Reviewed clinical findings and available treatment options. Metatarsal binder dispensed x 2. May benefit from Orthotic shoe inserts.

## 2014-06-10 NOTE — Patient Instructions (Signed)
Pain in both feet.  Noted of weakened first Metatarsal bone. May benefit from Museum/gallery exhibitions officerCustom Orthotics.  Metatarsal binder dispensed x 2.

## 2014-06-21 ENCOUNTER — Encounter: Payer: Self-pay | Admitting: Podiatry

## 2014-06-21 ENCOUNTER — Ambulatory Visit (INDEPENDENT_AMBULATORY_CARE_PROVIDER_SITE_OTHER): Payer: BC Managed Care – PPO | Admitting: Podiatry

## 2014-06-21 VITALS — BP 104/64 | HR 60

## 2014-06-21 DIAGNOSIS — M21969 Unspecified acquired deformity of unspecified lower leg: Secondary | ICD-10-CM

## 2014-06-21 DIAGNOSIS — M216X9 Other acquired deformities of unspecified foot: Secondary | ICD-10-CM

## 2014-06-21 DIAGNOSIS — M7750 Other enthesopathy of unspecified foot: Secondary | ICD-10-CM

## 2014-06-21 DIAGNOSIS — M7742 Metatarsalgia, left foot: Secondary | ICD-10-CM

## 2014-06-21 NOTE — Patient Instructions (Signed)
Casted for orthotics. Will contact patient when they are ready.

## 2014-06-21 NOTE — Progress Notes (Signed)
Patient came in to prepare for Orthotics. X-rays taken. Noted of elevated first Metatarsal bone, plantar calcaneal spur, normal STJ articulation with good arch and rectus forefoot bilateral. Both feet casted for Orthotics.

## 2014-10-17 ENCOUNTER — Encounter: Payer: Self-pay | Admitting: Podiatry

## 2014-12-19 ENCOUNTER — Encounter: Payer: Self-pay | Admitting: Gynecology

## 2014-12-19 ENCOUNTER — Ambulatory Visit (INDEPENDENT_AMBULATORY_CARE_PROVIDER_SITE_OTHER): Payer: BC Managed Care – PPO | Admitting: Gynecology

## 2014-12-19 ENCOUNTER — Other Ambulatory Visit: Payer: Self-pay

## 2014-12-19 VITALS — BP 110/60 | Ht 67.0 in | Wt 138.0 lb

## 2014-12-19 DIAGNOSIS — Z01419 Encounter for gynecological examination (general) (routine) without abnormal findings: Secondary | ICD-10-CM

## 2014-12-19 DIAGNOSIS — Z1231 Encounter for screening mammogram for malignant neoplasm of breast: Secondary | ICD-10-CM

## 2014-12-19 DIAGNOSIS — Z30431 Encounter for routine checking of intrauterine contraceptive device: Secondary | ICD-10-CM

## 2014-12-19 LAB — CBC WITH DIFFERENTIAL/PLATELET
Basophils Absolute: 0 10*3/uL (ref 0.0–0.1)
Basophils Relative: 0 % (ref 0–1)
EOS PCT: 1 % (ref 0–5)
Eosinophils Absolute: 0.1 10*3/uL (ref 0.0–0.7)
HEMATOCRIT: 33.7 % — AB (ref 36.0–46.0)
HEMOGLOBIN: 11.4 g/dL — AB (ref 12.0–15.0)
LYMPHS PCT: 29 % (ref 12–46)
Lymphs Abs: 2 10*3/uL (ref 0.7–4.0)
MCH: 30.9 pg (ref 26.0–34.0)
MCHC: 33.8 g/dL (ref 30.0–36.0)
MCV: 91.3 fL (ref 78.0–100.0)
MONO ABS: 0.4 10*3/uL (ref 0.1–1.0)
MONOS PCT: 6 % (ref 3–12)
MPV: 9.1 fL (ref 8.6–12.4)
NEUTROS ABS: 4.4 10*3/uL (ref 1.7–7.7)
Neutrophils Relative %: 64 % (ref 43–77)
Platelets: 237 10*3/uL (ref 150–400)
RBC: 3.69 MIL/uL — AB (ref 3.87–5.11)
RDW: 12.5 % (ref 11.5–15.5)
WBC: 6.9 10*3/uL (ref 4.0–10.5)

## 2014-12-19 LAB — LIPID PANEL
CHOL/HDL RATIO: 2.4 ratio
CHOLESTEROL: 161 mg/dL (ref 0–200)
HDL: 68 mg/dL (ref >39)
LDL Cholesterol: 82 mg/dL (ref 0–99)
TRIGLYCERIDES: 54 mg/dL (ref <150)
VLDL: 11 mg/dL (ref 0–40)

## 2014-12-19 NOTE — Progress Notes (Signed)
[name] [birth year] [id number]        39 y.o.  G2P2 for annual exam.  Several issues noted below.  Past medical history,surgical history, problem list, medications, allergies, family history and social history were all reviewed and documented as reviewed in the EPIC chart.  ROS:  Performed with pertinent positives and negatives included in the history, assessment and plan.   Additional significant findings :  none   Exam: [facility] person Vitals:   12/19/14 1456  BP: 110/60  Height:  (1.702 m)  Weight: 138 lb (62.596 kg)   General appearance:  Normal affect, orientation and appearance. Skin: Grossly normal HEENT: Without gross lesions.  No cervical or supraclavicular adenopathy. Thyroid normal.  Lungs:  Clear without wheezing, rales or rhonchi Cardiac: RR, without RMG Abdominal:  Soft, nontender, without masses, guarding, rebound, organomegaly or hernia Breasts:  Examined lying and sitting without masses, retractions, discharge or axillary adenopathy. Pelvic:  Ext/BUS/vagina normal  Cervix normal with IUD string visualized at external os  Uterus anteverted, normal size, shape and contour, midline and mobile nontender   Adnexa  Without masses or tenderness    Anus and perineum  Normal   Rectovaginal  Normal sphincter tone without palpated masses or tenderness.    Assessment/Plan:  40 y.o. G2P2 female for annual exam without menses, Mirena IUD.   1. Mirena IUD 12/2013. Doing well without menses. String visualized at external os. Continue to monitor. 2. Pap smear/HPV negative 2014. No Pap smear done today.  No history of abnormal Pap smears previously.  Plan repeat Pap smear at 3-5 year interval per current screening guidelines. 3. Mammography never. Recommended screening mammography as she has turned 38.  Names and numbers provided and she agrees to call to schedule. SBE monthly reviewed. 4. Health maintenance. Baseline CBC comprehensive metabolic panel lipid  profile urinalysis ordered. Follow up in one year, sooner as needed.     Dara Lords MD, 3:17 PM 12/19/2014

## 2014-12-19 NOTE — Patient Instructions (Signed)
Call to Schedule your mammogram  Facilities in [place]: 1)  [facility], [address] Phone: [phone] 2)  [facility]. [address] [place] Phone: [phone] 3)  Dr. Isaiah Blakes at [facility] [address] Phone: [phone]     Mammogram A mammogram is an X-ray test to find changes in a woman's breast. You should get a mammogram if:  You are 41 years of age or older  You have risk factors.   Your doctor recommends that you have one.  BEFORE THE TEST  Do not schedule the test the week before your period, especially if your breasts are sore during this time.  On the day of your mammogram:  Wash your breasts and armpits well. After washing, do not put on any deodorant or talcum powder on until after your test.   Eat and drink as you usually do.   Take your medicines as usual.   If you are diabetic and take insulin, make sure you:   Eat before coming for your test.   Take your insulin as usual.   If you cannot keep your appointment, call before the appointment to cancel. Schedule another appointment.  TEST  You will need to undress from the waist up. You will put on a hospital gown.   Your breast will be put on the mammogram machine, and it will press firmly on your breast with a piece of plastic called a compression paddle. This will make your breast flatter so that the machine can X-ray all parts of your breast.   Both breasts will be X-rayed. Each breast will be X-rayed from above and from the side. An X-ray might need to be taken again if the picture is not good enough.   The mammogram will last about 15 to 30 minutes.  AFTER THE TEST Finding out the results of your test Ask when your test results will be ready. Make sure you get your test results.  Document Released: 02/28/2009 Document Revised: 11/21/2011 Document Reviewed: 02/28/2009 [facility] Patient  Information 2012 [place].  You may obtain a copy of any labs that were done today by logging onto MyChart as outlined in the instructions provided with your AVS (after visit summary). The office will not call with normal lab results but certainly if there are any significant abnormalities then we will contact you.   Health Maintenance, Female A healthy lifestyle and preventative care can promote health and wellness.  Maintain regular health, dental, and eye exams.  Eat a healthy diet. Foods like vegetables, fruits, whole grains, low-fat dairy products, and lean protein foods contain the nutrients you need without too many calories. Decrease your intake of foods high in solid fats, added sugars, and salt. Get information about a proper diet from your caregiver, if necessary.  Regular physical exercise is one of the most important things you can do for your health. Most adults should get at least 150 minutes of moderate-intensity exercise (any activity that increases your heart rate and causes you to sweat) each week. In addition, most adults need muscle-strengthening exercises on 2 or more days a week.   Maintain a healthy weight. The body mass index (BMI) is a screening tool to identify possible weight problems. It provides an estimate of body fat based on height and weight. Your caregiver can help determine your BMI, and can help you achieve or maintain a healthy weight. For adults  20 years and older:  A BMI below 18.5 is considered underweight.  A BMI of 18.5 to 24.9 is normal.  A BMI of 25 to 29.9 is considered overweight.  A BMI of 30 and above is considered obese.  Maintain normal blood lipids and cholesterol by exercising and minimizing your intake of saturated fat. Eat a balanced diet with plenty of fruits and vegetables. Blood tests for lipids and cholesterol should begin at age 20 and be repeated every 5 years. If your lipid or cholesterol levels are high, you are over 50, or  you are a high risk for heart disease, you may need your cholesterol levels checked more frequently.Ongoing high lipid and cholesterol levels should be treated with medicines if diet and exercise are not effective.  If you smoke, find out from your caregiver how to quit. If you do not use tobacco, do not start.  Lung cancer screening is recommended for adults aged 55 80 years who are at high risk for developing lung cancer because of a history of smoking. Yearly low-dose computed tomography (CT) is recommended for people who have at least a 30-pack-year history of smoking and are a current smoker or have quit within the past 15 years. A pack year of smoking is smoking an average of 1 pack of cigarettes a day for 1 year (for example: 1 pack a day for 30 years or 2 packs a day for 15 years). Yearly screening should continue until the smoker has stopped smoking for at least 15 years. Yearly screening should also be stopped for people who develop a health problem that would prevent them from having lung cancer treatment.  If you are pregnant, do not drink alcohol. If you are breastfeeding, be very cautious about drinking alcohol. If you are not pregnant and choose to drink alcohol, do not exceed 1 drink per day. One drink is considered to be 12 ounces (355 mL) of beer, 5 ounces (148 mL) of wine, or 1.5 ounces (44 mL) of liquor.  Avoid use of street drugs. Do not share needles with anyone. Ask for help if you need support or instructions about stopping the use of drugs.  High blood pressure causes heart disease and increases the risk of stroke. Blood pressure should be checked at least every 1 to 2 years. Ongoing high blood pressure should be treated with medicines, if weight loss and exercise are not effective.  If you are 55 to 41 years old, ask your caregiver if you should take aspirin to prevent strokes.  Diabetes screening involves taking a blood sample to check your fasting blood sugar level. This  should be done once every 3 years, after age 45, if you are within normal weight and without risk factors for diabetes. Testing should be considered at a younger age or be carried out more frequently if you are overweight and have at least 1 risk factor for diabetes.  Breast cancer screening is essential preventative care for women. You should practice "breast self-awareness." This means understanding the normal appearance and feel of your breasts and may include breast self-examination. Any changes detected, no matter how small, should be reported to a caregiver. Women in their 20s and 30s should have a clinical breast exam (CBE) by a caregiver as part of a regular health exam every 1 to 3 years. After age 40, women should have a CBE every year. Starting at age 40, women should consider having a mammogram (breast X-ray) every year. Women who have a   family history of breast cancer should talk to their caregiver about genetic screening. Women at a high risk of breast cancer should talk to their caregiver about having an MRI and a mammogram every year.  Breast cancer gene (BRCA)-related cancer risk assessment is recommended for women who have family members with BRCA-related cancers. BRCA-related cancers include breast, ovarian, tubal, and peritoneal cancers. Having family members with these cancers may be associated with an increased risk for harmful changes (mutations) in the breast cancer genes BRCA1 and BRCA2. Results of the assessment will determine the need for genetic counseling and BRCA1 and BRCA2 testing.  The Pap test is a screening test for cervical cancer. Women should have a Pap test starting at age 33. Between ages 75 and 14, Pap tests should be repeated every 2 years. Beginning at age 4, you should have a Pap test every 3 years as long as the past 3 Pap tests have been normal. If you had a hysterectomy for a problem that was not cancer or a condition that could lead to cancer, then you no longer  need Pap tests. If you are between ages 72 and 12, and you have had normal Pap tests going back 10 years, you no longer need Pap tests. If you have had past treatment for cervical cancer or a condition that could lead to cancer, you need Pap tests and screening for cancer for at least 20 years after your treatment. If Pap tests have been discontinued, risk factors (such as a new sexual partner) need to be reassessed to determine if screening should be resumed. Some women have medical problems that increase the chance of getting cervical cancer. In these cases, your caregiver may recommend more frequent screening and Pap tests.  The human papillomavirus (HPV) test is an additional test that may be used for cervical cancer screening. The HPV test looks for the virus that can cause the cell changes on the cervix. The cells collected during the Pap test can be tested for HPV. The HPV test could be used to screen women aged 84 years and older, and should be used in women of any age who have unclear Pap test results. After the age of 73, women should have HPV testing at the same frequency as a Pap test.  Colorectal cancer can be detected and often prevented. Most routine colorectal cancer screening begins at the age of 56 and continues through age 76. However, your caregiver may recommend screening at an earlier age if you have risk factors for colon cancer. On a yearly basis, your caregiver may provide home test kits to check for hidden blood in the stool. Use of a small camera at the end of a tube, to directly examine the colon (sigmoidoscopy or colonoscopy), can detect the earliest forms of colorectal cancer. Talk to your caregiver about this at age 20, when routine screening begins. Direct examination of the colon should be repeated every 5 to 10 years through age 83, unless early forms of pre-cancerous polyps or small growths are found.  Hepatitis C blood testing is recommended for all people born from [birth year]  through 1965 and any individual with known risks for hepatitis C.  Practice safe sex. Use condoms and avoid high-risk sexual practices to reduce the spread of sexually transmitted infections (STIs). Sexually active women aged 28 and younger should be checked for Chlamydia, which is a common sexually transmitted infection. Older women with new or multiple partners should also be tested for Chlamydia. Testing for  other STIs is recommended if you are sexually active and at increased risk.  Osteoporosis is a disease in which the bones lose minerals and strength with aging. This can result in serious bone fractures. The risk of osteoporosis can be identified using a bone density scan. Women ages 35 and over and women at risk for fractures or osteoporosis should discuss screening with their caregivers. Ask your caregiver whether you should be taking a calcium supplement or vitamin D to reduce the rate of osteoporosis.  Menopause can be associated with physical symptoms and risks. Hormone replacement therapy is available to decrease symptoms and risks. You should talk to your caregiver about whether hormone replacement therapy is right for you.  Use sunscreen. Apply sunscreen liberally and repeatedly throughout the day. You should seek shade when your shadow is shorter than you. Protect yourself by wearing long sleeves, pants, a wide-brimmed hat, and sunglasses year round, whenever you are outdoors.  Notify your caregiver of new moles or changes in moles, especially if there is a change in shape or color. Also notify your caregiver if a mole is larger than the size of a pencil eraser.  Stay current with your immunizations. Document Released: 06/17/2011 Document Revised: 03/29/2013 Document Reviewed: 06/17/2011 [facility] Patient Information 2014 [place].

## 2014-12-20 ENCOUNTER — Other Ambulatory Visit: Payer: Self-pay | Admitting: Gynecology

## 2014-12-20 DIAGNOSIS — D649 Anemia, unspecified: Secondary | ICD-10-CM

## 2014-12-20 LAB — URINALYSIS W MICROSCOPIC + REFLEX CULTURE
BACTERIA UA: NONE SEEN
BILIRUBIN URINE: NEGATIVE
CASTS: NONE SEEN
Crystals: NONE SEEN
Glucose, UA: NEGATIVE mg/dL
Hgb urine dipstick: NEGATIVE
KETONES UR: NEGATIVE mg/dL
Leukocytes, UA: NEGATIVE
Nitrite: NEGATIVE
PH: 7.5 (ref 5.0–8.0)
Protein, ur: NEGATIVE mg/dL
Specific Gravity, Urine: 1.022 (ref 1.005–1.030)
Squamous Epithelial / LPF: NONE SEEN
Urobilinogen, UA: 0.2 mg/dL (ref 0.0–1.0)

## 2014-12-20 LAB — COMPREHENSIVE METABOLIC PANEL
ALT: 13 U/L (ref 0–35)
AST: 18 U/L (ref 0–37)
Albumin: 4.3 g/dL (ref 3.5–5.2)
Alkaline Phosphatase: 31 U/L — ABNORMAL LOW (ref 39–117)
BUN: 16 mg/dL (ref 6–23)
CALCIUM: 9.4 mg/dL (ref 8.4–10.5)
CO2: 26 meq/L (ref 19–32)
CREATININE: 0.83 mg/dL (ref 0.50–1.10)
Chloride: 102 mEq/L (ref 96–112)
GLUCOSE: 84 mg/dL (ref 70–99)
Potassium: 3.8 mEq/L (ref 3.5–5.3)
Sodium: 137 mEq/L (ref 135–145)
Total Bilirubin: 0.4 mg/dL (ref 0.2–1.2)
Total Protein: 6.8 g/dL (ref 6.0–8.3)

## 2015-01-05 ENCOUNTER — Ambulatory Visit: Payer: Self-pay

## 2015-01-06 ENCOUNTER — Ambulatory Visit: Payer: Self-pay

## 2015-01-16 ENCOUNTER — Ambulatory Visit
Admission: RE | Admit: 2015-01-16 | Discharge: 2015-01-16 | Disposition: A | Discharge status: Home / Self Care | Payer: BC Managed Care – PPO | Source: Ambulatory Visit

## 2015-01-16 DIAGNOSIS — Z1231 Encounter for screening mammogram for malignant neoplasm of breast: Secondary | ICD-10-CM

## 2015-01-18 ENCOUNTER — Other Ambulatory Visit: Payer: Self-pay | Admitting: Gynecology

## 2015-01-18 DIAGNOSIS — R928 Other abnormal and inconclusive findings on diagnostic imaging of breast: Secondary | ICD-10-CM

## 2015-02-02 ENCOUNTER — Ambulatory Visit
Admission: RE | Admit: 2015-02-02 | Discharge: 2015-02-02 | Disposition: A | Discharge status: Home / Self Care | Payer: BC Managed Care – PPO | Source: Ambulatory Visit | Attending: Gynecology | Admitting: Gynecology

## 2015-02-02 DIAGNOSIS — R928 Other abnormal and inconclusive findings on diagnostic imaging of breast: Secondary | ICD-10-CM

## 2015-04-13 ENCOUNTER — Other Ambulatory Visit: Payer: BC Managed Care – PPO

## 2015-04-13 DIAGNOSIS — D649 Anemia, unspecified: Secondary | ICD-10-CM

## 2015-04-13 LAB — CBC WITH DIFFERENTIAL/PLATELET
Basophils Absolute: 0.1 10*3/uL (ref 0.0–0.1)
Basophils Relative: 1 % (ref 0–1)
Eosinophils Absolute: 0.1 10*3/uL (ref 0.0–0.7)
Eosinophils Relative: 2 % (ref 0–5)
HCT: 34.9 % — ABNORMAL LOW (ref 36.0–46.0)
HEMOGLOBIN: 11.6 g/dL — AB (ref 12.0–15.0)
LYMPHS ABS: 2.3 10*3/uL (ref 0.7–4.0)
LYMPHS PCT: 36 % (ref 12–46)
MCH: 30.7 pg (ref 26.0–34.0)
MCHC: 33.2 g/dL (ref 30.0–36.0)
MCV: 92.3 fL (ref 78.0–100.0)
MPV: 9 fL (ref 8.6–12.4)
Monocytes Absolute: 0.4 10*3/uL (ref 0.1–1.0)
Monocytes Relative: 6 % (ref 3–12)
NEUTROS PCT: 55 % (ref 43–77)
Neutro Abs: 3.6 10*3/uL (ref 1.7–7.7)
Platelets: 236 10*3/uL (ref 150–400)
RBC: 3.78 MIL/uL — ABNORMAL LOW (ref 3.87–5.11)
RDW: 12.7 % (ref 11.5–15.5)
WBC: 6.5 10*3/uL (ref 4.0–10.5)

## 2015-06-21 ENCOUNTER — Other Ambulatory Visit: Payer: Self-pay | Admitting: *Deleted

## 2015-06-21 DIAGNOSIS — E611 Iron deficiency: Secondary | ICD-10-CM

## 2015-08-15 ENCOUNTER — Other Ambulatory Visit: Payer: Self-pay | Admitting: Gynecology

## 2015-08-15 DIAGNOSIS — N6320 Unspecified lump in the left breast, unspecified quadrant: Secondary | ICD-10-CM

## 2015-09-08 ENCOUNTER — Ambulatory Visit
Admission: RE | Admit: 2015-09-08 | Discharge: 2015-09-08 | Disposition: A | Discharge status: Home / Self Care | Payer: BC Managed Care – PPO | Source: Ambulatory Visit | Attending: Gynecology | Admitting: Gynecology

## 2015-09-08 DIAGNOSIS — N6320 Unspecified lump in the left breast, unspecified quadrant: Secondary | ICD-10-CM

## 2015-12-21 ENCOUNTER — Encounter: Payer: Self-pay | Admitting: Gynecology

## 2015-12-21 ENCOUNTER — Ambulatory Visit (INDEPENDENT_AMBULATORY_CARE_PROVIDER_SITE_OTHER): Payer: BC Managed Care – PPO | Admitting: Gynecology

## 2015-12-21 VITALS — BP 114/70 | Ht 68.0 in | Wt 138.0 lb

## 2015-12-21 DIAGNOSIS — Z30431 Encounter for routine checking of intrauterine contraceptive device: Secondary | ICD-10-CM

## 2015-12-21 DIAGNOSIS — Z01419 Encounter for gynecological examination (general) (routine) without abnormal findings: Secondary | ICD-10-CM

## 2015-12-21 DIAGNOSIS — L700 Acne vulgaris: Secondary | ICD-10-CM

## 2015-12-21 LAB — CBC WITH DIFFERENTIAL/PLATELET
BASOS PCT: 1 % (ref 0–1)
Basophils Absolute: 0.1 10*3/uL (ref 0.0–0.1)
Eosinophils Absolute: 0.1 10*3/uL (ref 0.0–0.7)
Eosinophils Relative: 1 % (ref 0–5)
HEMATOCRIT: 35.3 % — AB (ref 36.0–46.0)
HEMOGLOBIN: 12.2 g/dL (ref 12.0–15.0)
LYMPHS PCT: 39 % (ref 12–46)
Lymphs Abs: 2.3 10*3/uL (ref 0.7–4.0)
MCH: 31.4 pg (ref 26.0–34.0)
MCHC: 34.6 g/dL (ref 30.0–36.0)
MCV: 90.7 fL (ref 78.0–100.0)
MONOS PCT: 8 % (ref 3–12)
MPV: 8.6 fL (ref 8.6–12.4)
Monocytes Absolute: 0.5 10*3/uL (ref 0.1–1.0)
NEUTROS ABS: 3 10*3/uL (ref 1.7–7.7)
Neutrophils Relative %: 51 % (ref 43–77)
Platelets: 260 10*3/uL (ref 150–400)
RBC: 3.89 MIL/uL (ref 3.87–5.11)
RDW: 12.9 % (ref 11.5–15.5)
WBC: 5.9 10*3/uL (ref 4.0–10.5)

## 2015-12-21 MED ORDER — NORETHINDRONE ACET-ETHINYL EST 1-20 MG-MCG PO TABS: 1.0000 | ORAL_TABLET | Freq: Every day | ORAL | Status: DC

## 2015-12-21 NOTE — Progress Notes (Signed)
Natalie DresserShawnee Hughes 05/25/1976 [id number]        40 y.o.  G2P2  for annual exam.  Several issues noted below.  Past medical history,surgical history, problem list, medications, allergies, family history and social history were all reviewed and documented as reviewed in the EPIC chart.  ROS:  Performed with pertinent positives and negatives included in the history, assessment and plan.   Additional significant findings :  none   Exam: Kennon PortelaKim Gardner assistant Filed Vitals:   12/21/15 1516  BP: 114/70  Height: 5\' 8"  (1.727 m)  Weight: 138 lb (62.596 kg)   General appearance:  Normal affect, orientation and appearance. Skin: Grossly normal HEENT: Without gross lesions.  No cervical or supraclavicular adenopathy. Thyroid normal.  Lungs:  Clear without wheezing, rales or rhonchi Cardiac: RR, without RMG Abdominal:  Soft, nontender, without masses, guarding, rebound, organomegaly or hernia Breasts:  Examined lying and sitting without masses, retractions, discharge or axillary adenopathy. Pelvic:  Ext/BUS/vagina normal  Cervix normal. IUD string visualized external os  Uterus anteverted, normal size, shape and contour, midline and mobile nontender   Adnexa  Without masses or tenderness    Anus and perineum  Normal   Rectovaginal  Normal sphincter tone without palpated masses or tenderness.    Assessment/Plan:  40 y.o. G2P2 female for annual exam without menses, Mirena IUD.   1. Mirena IUD 12/2013. Doing well without menses. IUD string visualized. 2. Recurrent acne monthly. Involves face chest and back. Last 10 days. Has used intermittent antibiotics to help control. Discussed possible low-dose oral contraceptive ovarian suppression. Does have IUD in place. Never smoked and not being followed for any medical issues. Risks to include increased risk of thrombosis such as stroke heart attack DVT reviewed. Patient wants to go ahead and try this. Every other month to every third month withdrawal  option also reviewed. Loestrin 1/20 generic equivalent prescribed. Patient when start this will see how she does with this and call me if she has any issues at all. 3. MRV 08/2015. Continue with annual mammography when do. SBE monthly reviewed. 4. Pap smear/HPV negative 2014. No Pap smear done today. No history of significant abnormal Pap smears. Repeat in 3-5 year interval. 5. Health maintenance. Comprehensive metabolic panel, lipid profile normal one year ago.  Hemoglobin  11.6. Will repeat CBC now with urine analysis. Follow up in one year, sooner if any issues.   Dara LordsFONTAINE,Jenaro Souder P MD, 3:34 PM 12/21/2015

## 2015-12-21 NOTE — Patient Instructions (Signed)
Start on the birth control pills as we discussed. Call me if you have any issues with this.  You may obtain a copy of any labs that were done today by logging onto MyChart as outlined in the instructions provided with your AVS (after visit summary). The office will not call with normal lab results but certainly if there are any significant abnormalities then we will contact you.   Health Maintenance Adopting a healthy lifestyle and getting preventive care can go a long way to promote health and wellness. Talk with your health care provider about what schedule of regular examinations is right for you. This is a good chance for you to check in with your provider about disease prevention and staying healthy. In between checkups, there are plenty of things you can do on your own. Experts have done a lot of research about which lifestyle changes and preventive measures are most likely to keep you healthy. Ask your health care provider for more information. WEIGHT AND DIET  Eat a healthy diet  Be sure to include plenty of vegetables, fruits, low-fat dairy products, and lean protein.  Do not eat a lot of foods high in solid fats, added sugars, or salt.  Get regular exercise. This is one of the most important things you can do for your health.  Most adults should exercise for at least 150 minutes each week. The exercise should increase your heart rate and make you sweat (moderate-intensity exercise).  Most adults should also do strengthening exercises at least twice a week. This is in addition to the moderate-intensity exercise.  Maintain a healthy weight  Body mass index (BMI) is a measurement that can be used to identify possible weight problems. It estimates body fat based on height and weight. Your health care provider can help determine your BMI and help you achieve or maintain a healthy weight.  For females 34 years of age and older:   A BMI below 18.5 is considered underweight.  A BMI of  18.5 to 24.9 is normal.  A BMI of 25 to 29.9 is considered overweight.  A BMI of 30 and above is considered obese.  Watch levels of cholesterol and blood lipids  You should start having your blood tested for lipids and cholesterol at 42 years of age, then have this test every 5 years.  You may need to have your cholesterol levels checked more often if:  Your lipid or cholesterol levels are high.  You are older than 42 years of age.  You are at high risk for heart disease.  CANCER SCREENING   Lung Cancer  Lung cancer screening is recommended for adults 103-62 years old who are at high risk for lung cancer because of a history of smoking.  A yearly low-dose CT scan of the lungs is recommended for people who:  Currently smoke.  Have quit within the past 15 years.  Have at least a 30-pack-year history of smoking. A pack year is smoking an average of one pack of cigarettes a day for 1 year.  Yearly screening should continue until it has been 15 years since you quit.  Yearly screening should stop if you develop a health problem that would prevent you from having lung cancer treatment.  Breast Cancer  Practice breast self-awareness. This means understanding how your breasts normally appear and feel.  It also means doing regular breast self-exams. Let your health care provider know about any changes, no matter how small.  If you are in  your 20s or 30s, you should have a clinical breast exam (CBE) by a health care provider every 1-3 years as part of a regular health exam.  If you are 110 or older, have a CBE every year. Also consider having a breast X-ray (mammogram) every year.  If you have a family history of breast cancer, talk to your health care provider about genetic screening.  If you are at high risk for breast cancer, talk to your health care provider about having an MRI and a mammogram every year.  Breast cancer gene (BRCA) assessment is recommended for women who  have family members with BRCA-related cancers. BRCA-related cancers include:  Breast.  Ovarian.  Tubal.  Peritoneal cancers.  Results of the assessment will determine the need for genetic counseling and BRCA1 and BRCA2 testing. Cervical Cancer Routine pelvic examinations to screen for cervical cancer are no longer recommended for nonpregnant women who are considered low risk for cancer of the pelvic organs (ovaries, uterus, and vagina) and who do not have symptoms. A pelvic examination may be necessary if you have symptoms including those associated with pelvic infections. Ask your health care provider if a screening pelvic exam is right for you.   The Pap test is the screening test for cervical cancer for women who are considered at risk.  If you had a hysterectomy for a problem that was not cancer or a condition that could lead to cancer, then you no longer need Pap tests.  If you are older than 65 years, and you have had normal Pap tests for the past 10 years, you no longer need to have Pap tests.  If you have had past treatment for cervical cancer or a condition that could lead to cancer, you need Pap tests and screening for cancer for at least 20 years after your treatment.  If you no longer get a Pap test, assess your risk factors if they change (such as having a new sexual partner). This can affect whether you should start being screened again.  Some women have medical problems that increase their chance of getting cervical cancer. If this is the case for you, your health care provider may recommend more frequent screening and Pap tests.  The human papillomavirus (HPV) test is another test that may be used for cervical cancer screening. The HPV test looks for the virus that can cause cell changes in the cervix. The cells collected during the Pap test can be tested for HPV.  The HPV test can be used to screen women 38 years of age and older. Getting tested for HPV can extend the  interval between normal Pap tests from three to five years.  An HPV test also should be used to screen women of any age who have unclear Pap test results.  After 42 years of age, women should have HPV testing as often as Pap tests.  Colorectal Cancer  This type of cancer can be detected and often prevented.  Routine colorectal cancer screening usually begins at 42 years of age and continues through 42 years of age.  Your health care provider may recommend screening at an earlier age if you have risk factors for colon cancer.  Your health care provider may also recommend using home test kits to check for hidden blood in the stool.  A small camera at the end of a tube can be used to examine your colon directly (sigmoidoscopy or colonoscopy). This is done to check for the earliest forms of  colorectal cancer.  Routine screening usually begins at age 39.  Direct examination of the colon should be repeated every 5-10 years through 42 years of age. However, you may need to be screened more often if early forms of precancerous polyps or small growths are found. Skin Cancer  Check your skin from head to toe regularly.  Tell your health care provider about any new moles or changes in moles, especially if there is a change in a mole's shape or color.  Also tell your health care provider if you have a mole that is larger than the size of a pencil eraser.  Always use sunscreen. Apply sunscreen liberally and repeatedly throughout the day.  Protect yourself by wearing long sleeves, pants, a wide-brimmed hat, and sunglasses whenever you are outside. HEART DISEASE, DIABETES, AND HIGH BLOOD PRESSURE   Have your blood pressure checked at least every 1-2 years. High blood pressure causes heart disease and increases the risk of stroke.  If you are between 62 years and 22 years old, ask your health care provider if you should take aspirin to prevent strokes.  Have regular diabetes screenings. This  involves taking a blood sample to check your fasting blood sugar level.  If you are at a normal weight and have a low risk for diabetes, have this test once every three years after 42 years of age.  If you are overweight and have a high risk for diabetes, consider being tested at a younger age or more often. PREVENTING INFECTION  Hepatitis B  If you have a higher risk for hepatitis B, you should be screened for this virus. You are considered at high risk for hepatitis B if:  You were born in a country where hepatitis B is common. Ask your health care provider which countries are considered high risk.  Your parents were born in a high-risk country, and you have not been immunized against hepatitis B (hepatitis B vaccine).  You have HIV or AIDS.  You use needles to inject street drugs.  You live with someone who has hepatitis B.  You have had sex with someone who has hepatitis B.  You get hemodialysis treatment.  You take certain medicines for conditions, including cancer, organ transplantation, and autoimmune conditions. Hepatitis C  Blood testing is recommended for:  Everyone born from [birth year] through 1967.  Anyone with known risk factors for hepatitis C. Sexually transmitted infections (STIs)  You should be screened for sexually transmitted infections (STIs) including gonorrhea and chlamydia if:  You are sexually active and are younger than 42 years of age.  You are older than 42 years of age and your health care provider tells you that you are at risk for this type of infection.  Your sexual activity has changed since you were last screened and you are at an increased risk for chlamydia or gonorrhea. Ask your health care provider if you are at risk.  If you do not have HIV, but are at risk, it may be recommended that you take a prescription medicine daily to prevent HIV infection. This is called pre-exposure prophylaxis (PrEP). You are considered at risk if:  You are  sexually active and do not regularly use condoms or know the HIV status of your partner(s).  You take drugs by injection.  You are sexually active with a partner who has HIV. Talk with your health care provider about whether you are at high risk of being infected with HIV. If you choose to begin PrEP,  you should first be tested for HIV. You should then be tested every 3 months for as long as you are taking PrEP.  PREGNANCY   If you are premenopausal and you may become pregnant, ask your health care provider about preconception counseling.  If you may become pregnant, take 400 to 800 micrograms (mcg) of folic acid every day.  If you want to prevent pregnancy, talk to your health care provider about birth control (contraception). OSTEOPOROSIS AND MENOPAUSE   Osteoporosis is a disease in which the bones lose minerals and strength with aging. This can result in serious bone fractures. Your risk for osteoporosis can be identified using a bone density scan.  If you are 33 years of age or older, or if you are at risk for osteoporosis and fractures, ask your health care provider if you should be screened.  Ask your health care provider whether you should take a calcium or vitamin D supplement to lower your risk for osteoporosis.  Menopause may have certain physical symptoms and risks.  Hormone replacement therapy may reduce some of these symptoms and risks. Talk to your health care provider about whether hormone replacement therapy is right for you.  HOME CARE INSTRUCTIONS   Schedule regular health, dental, and eye exams.  Stay current with your immunizations.   Do not use any tobacco products including cigarettes, chewing tobacco, or electronic cigarettes.  If you are pregnant, do not drink alcohol.  If you are breastfeeding, limit how much and how often you drink alcohol.  Limit alcohol intake to no more than 1 drink per day for nonpregnant women. One drink equals 12 ounces of beer, 5  ounces of wine, or 1 ounces of hard liquor.  Do not use street drugs.  Do not share needles.  Ask your health care provider for help if you need support or information about quitting drugs.  Tell your health care provider if you often feel depressed.  Tell your health care provider if you have ever been abused or do not feel safe at home. Document Released: 06/17/2011 Document Revised: 04/18/2014 Document Reviewed: 11/03/2013 [facility] Patient Information 2015 [place], [place]. This information is not intended to replace advice given to you by your health care provider. Make sure you discuss any questions you have with your health care provider.

## 2015-12-22 LAB — URINALYSIS W MICROSCOPIC + REFLEX CULTURE
Bacteria, UA: NONE SEEN [HPF]
Bilirubin Urine: NEGATIVE
CASTS: NONE SEEN [LPF]
GLUCOSE, UA: NEGATIVE
HGB URINE DIPSTICK: NEGATIVE
KETONES UR: NEGATIVE
LEUKOCYTES UA: NEGATIVE
Nitrite: NEGATIVE
Protein, ur: NEGATIVE
RBC / HPF: NONE SEEN RBC/HPF (ref <=2)
SPECIFIC GRAVITY, URINE: 1.024 (ref 1.001–1.035)
WBC, UA: NONE SEEN WBC/HPF (ref <=5)
Yeast: NONE SEEN [HPF]
pH: 7 (ref 5.0–8.0)

## 2016-03-27 ENCOUNTER — Other Ambulatory Visit: Payer: Self-pay | Admitting: Gynecology

## 2016-03-27 DIAGNOSIS — R921 Mammographic calcification found on diagnostic imaging of breast: Secondary | ICD-10-CM

## 2016-04-30 ENCOUNTER — Ambulatory Visit
Admission: RE | Admit: 2016-04-30 | Discharge: 2016-04-30 | Disposition: A | Discharge status: Home / Self Care | Payer: BC Managed Care – PPO | Source: Ambulatory Visit | Attending: Gynecology | Admitting: Gynecology

## 2016-04-30 DIAGNOSIS — R921 Mammographic calcification found on diagnostic imaging of breast: Secondary | ICD-10-CM

## 2016-08-15 ENCOUNTER — Other Ambulatory Visit: Payer: Self-pay | Admitting: Gynecology

## 2016-09-09 ENCOUNTER — Other Ambulatory Visit: Payer: Self-pay

## 2016-09-09 MED ORDER — NORETHINDRONE ACET-ETHINYL EST 1-20 MG-MCG PO TABS: 1.0000 | ORAL_TABLET | Freq: Every day | ORAL | 1 refills | Status: DC

## 2016-09-10 ENCOUNTER — Other Ambulatory Visit: Payer: Self-pay

## 2016-12-23 ENCOUNTER — Ambulatory Visit (INDEPENDENT_AMBULATORY_CARE_PROVIDER_SITE_OTHER): Payer: BC Managed Care – PPO | Admitting: Gynecology

## 2016-12-23 ENCOUNTER — Encounter: Payer: Self-pay | Admitting: Gynecology

## 2016-12-23 VITALS — BP 110/72 | Ht 68.0 in | Wt 137.0 lb

## 2016-12-23 DIAGNOSIS — Z01419 Encounter for gynecological examination (general) (routine) without abnormal findings: Secondary | ICD-10-CM | POA: Diagnosis not present

## 2016-12-23 DIAGNOSIS — Z13220 Encounter for screening for lipoid disorders: Secondary | ICD-10-CM | POA: Diagnosis not present

## 2016-12-23 LAB — CBC WITH DIFFERENTIAL/PLATELET
BASOS PCT: 0 %
Basophils Absolute: 0 cells/uL (ref 0–200)
EOS PCT: 1 %
Eosinophils Absolute: 85 cells/uL (ref 15–500)
HCT: 35.7 % (ref 35.0–45.0)
Hemoglobin: 11.9 g/dL (ref 11.7–15.5)
Lymphocytes Relative: 30 %
Lymphs Abs: 2550 cells/uL (ref 850–3900)
MCH: 31.3 pg (ref 27.0–33.0)
MCHC: 33.3 g/dL (ref 32.0–36.0)
MCV: 93.9 fL (ref 80.0–100.0)
MONOS PCT: 6 %
MPV: 8.8 fL (ref 7.5–12.5)
Monocytes Absolute: 510 cells/uL (ref 200–950)
Neutro Abs: 5355 cells/uL (ref 1500–7800)
Neutrophils Relative %: 63 %
PLATELETS: 282 10*3/uL (ref 140–400)
RBC: 3.8 MIL/uL (ref 3.80–5.10)
RDW: 12.5 % (ref 11.0–15.0)
WBC: 8.5 10*3/uL (ref 3.8–10.8)

## 2016-12-23 LAB — LIPID PANEL
CHOL/HDL RATIO: 2.8 ratio (ref <5.0)
CHOLESTEROL: 169 mg/dL (ref <200)
HDL: 60 mg/dL (ref >50)
LDL Cholesterol: 82 mg/dL (ref <100)
Triglycerides: 133 mg/dL (ref <150)
VLDL: 27 mg/dL (ref <30)

## 2016-12-23 LAB — COMPREHENSIVE METABOLIC PANEL
ALK PHOS: 24 U/L — AB (ref 33–115)
ALT: 11 U/L (ref 6–29)
AST: 14 U/L (ref 10–30)
Albumin: 3.8 g/dL (ref 3.6–5.1)
BUN: 15 mg/dL (ref 7–25)
CO2: 26 mmol/L (ref 20–31)
CREATININE: 0.75 mg/dL (ref 0.50–1.10)
Calcium: 9.2 mg/dL (ref 8.6–10.2)
Chloride: 106 mmol/L (ref 98–110)
Glucose, Bld: 85 mg/dL (ref 65–99)
Potassium: 3.8 mmol/L (ref 3.5–5.3)
SODIUM: 140 mmol/L (ref 135–146)
Total Bilirubin: 0.4 mg/dL (ref 0.2–1.2)
Total Protein: 6.9 g/dL (ref 6.1–8.1)

## 2016-12-23 NOTE — Progress Notes (Signed)
    Natalie DresserShawnee Hughes 1976/02/23 [id number]        41 y.o.  G2P2 for annual exam.  Currently on oral contraceptives for acne suppression but not sure that these really are helping. Also with Mirena IUD for contraception.  Past medical history,surgical history, problem list, medications, allergies, family history and social history were all reviewed and documented as reviewed in the EPIC chart.  ROS:  Performed with pertinent positives and negatives included in the history, assessment and plan.   Additional significant findings :  None   Exam: Natalie PortelaKim Hughes assistant Vitals:   12/23/16 1517  BP: 110/72  Weight: 137 lb (62.1 kg)  Height: 5\' 8"  (1.727 m)   Body mass index is 20.83 kg/m.  General appearance:  Normal affect, orientation and appearance. Skin: Grossly normal HEENT: Without gross lesions.  No cervical or supraclavicular adenopathy. Thyroid normal.  Lungs:  Clear without wheezing, rales or rhonchi Cardiac: RR, without RMG Abdominal:  Soft, nontender, without masses, guarding, rebound, organomegaly or hernia Breasts:  Examined lying and sitting without masses, retractions, discharge or axillary adenopathy. Pelvic:  Ext, BUS, Vagina normal  Cervix normal. IUD string visualized  Uterus anteverted, normal size, shape and contour, midline and mobile nontender   Adnexa without masses or tenderness    Anus and perineum normal   Rectovaginal normal sphincter tone without palpated masses or tenderness.    Assessment/Plan:  41 y.o. G2P2 female for annual exam without menses, Mirena IUD.   1. Mirena IUD 12/2013. Doing well without menses. IUD string visualized. 2. On oral contraceptives for acne suppression but not sure it is helping. Recommended discontinuing and seeing how she does. Also following up with dermatology to see if alternative treatments would work better. Patient will call if she decides to reinitiate oral contraceptives. Risks to include thrombosis such as stroke  heart attack DVT reviewed. Never smoked and not being followed for medical issues. 3. Mammography 04/2016. Continue with annual mammography when due. SBE monthly reviewed. 4. Pap smear/HPV 2014 negative. No Pap smear done today. No history of significant abnormal Pap smears. Repeat approaching 5 year interval per current screening guidelines. 5. Health maintenance. Baseline CBC, CMP, lipid profile, urinalysis done. Follow up 1 year, sooner as needed.   Natalie LordsFONTAINE,[name] P MD, 3:43 PM 12/23/2016

## 2016-12-23 NOTE — Patient Instructions (Signed)
Call if you want to reinitiate the birth control pills.  You may obtain a copy of any labs that were done today by logging onto MyChart as outlined in the instructions provided with your AVS (after visit summary). The office will not call with normal lab results but certainly if there are any significant abnormalities then we will contact you.   Health Maintenance Adopting a healthy lifestyle and getting preventive care can go a long way to promote health and wellness. Talk with your health care provider about what schedule of regular examinations is right for you. This is a good chance for you to check in with your provider about disease prevention and staying healthy. In between checkups, there are plenty of things you can do on your own. Experts have done a lot of research about which lifestyle changes and preventive measures are most likely to keep you healthy. Ask your health care provider for more information. WEIGHT AND DIET  Eat a healthy diet  Be sure to include plenty of vegetables, fruits, low-fat dairy products, and lean protein.  Do not eat a lot of foods high in solid fats, added sugars, or salt.  Get regular exercise. This is one of the most important things you can do for your health.  Most adults should exercise for at least 150 minutes each week. The exercise should increase your heart rate and make you sweat (moderate-intensity exercise).  Most adults should also do strengthening exercises at least twice a week. This is in addition to the moderate-intensity exercise.  Maintain a healthy weight  Body mass index (BMI) is a measurement that can be used to identify possible weight problems. It estimates body fat based on height and weight. Your health care provider can help determine your BMI and help you achieve or maintain a healthy weight.  For females 18 years of age and older:   A BMI below 18.5 is considered underweight.  A BMI of 18.5 to 24.9 is normal.  A BMI of  25 to 29.9 is considered overweight.  A BMI of 30 and above is considered obese.  Watch levels of cholesterol and blood lipids  You should start having your blood tested for lipids and cholesterol at 43 years of age, then have this test every 5 years.  You may need to have your cholesterol levels checked more often if:  Your lipid or cholesterol levels are high.  You are older than 43 years of age.  You are at high risk for heart disease.  CANCER SCREENING   Lung Cancer  Lung cancer screening is recommended for adults 20-80 years old who are at high risk for lung cancer because of a history of smoking.  A yearly low-dose CT scan of the lungs is recommended for people who:  Currently smoke.  Have quit within the past 15 years.  Have at least a 30-pack-year history of smoking. A pack year is smoking an average of one pack of cigarettes a day for 1 year.  Yearly screening should continue until it has been 15 years since you quit.  Yearly screening should stop if you develop a health problem that would prevent you from having lung cancer treatment.  Breast Cancer  Practice breast self-awareness. This means understanding how your breasts normally appear and feel.  It also means doing regular breast self-exams. Let your health care provider know about any changes, no matter how small.  If you are in your 20s or 30s, you should have a  clinical breast exam (CBE) by a health care provider every 1-3 years as part of a regular health exam.  If you are 40 or older, have a CBE every year. Also consider having a breast X-ray (mammogram) every year.  If you have a family history of breast cancer, talk to your health care provider about genetic screening.  If you are at high risk for breast cancer, talk to your health care provider about having an MRI and a mammogram every year.  Breast cancer gene (BRCA) assessment is recommended for women who have family members with BRCA-related  cancers. BRCA-related cancers include:  Breast.  Ovarian.  Tubal.  Peritoneal cancers.  Results of the assessment will determine the need for genetic counseling and BRCA1 and BRCA2 testing. Cervical Cancer Routine pelvic examinations to screen for cervical cancer are no longer recommended for nonpregnant women who are considered low risk for cancer of the pelvic organs (ovaries, uterus, and vagina) and who do not have symptoms. A pelvic examination may be necessary if you have symptoms including those associated with pelvic infections. Ask your health care provider if a screening pelvic exam is right for you.   The Pap test is the screening test for cervical cancer for women who are considered at risk.  If you had a hysterectomy for a problem that was not cancer or a condition that could lead to cancer, then you no longer need Pap tests.  If you are older than 65 years, and you have had normal Pap tests for the past 10 years, you no longer need to have Pap tests.  If you have had past treatment for cervical cancer or a condition that could lead to cancer, you need Pap tests and screening for cancer for at least 20 years after your treatment.  If you no longer get a Pap test, assess your risk factors if they change (such as having a new sexual partner). This can affect whether you should start being screened again.  Some women have medical problems that increase their chance of getting cervical cancer. If this is the case for you, your health care provider may recommend more frequent screening and Pap tests.  The human papillomavirus (HPV) test is another test that may be used for cervical cancer screening. The HPV test looks for the virus that can cause cell changes in the cervix. The cells collected during the Pap test can be tested for HPV.  The HPV test can be used to screen women 30 years of age and older. Getting tested for HPV can extend the interval between normal Pap tests from  three to five years.  An HPV test also should be used to screen women of any age who have unclear Pap test results.  After 43 years of age, women should have HPV testing as often as Pap tests.  Colorectal Cancer  This type of cancer can be detected and often prevented.  Routine colorectal cancer screening usually begins at 43 years of age and continues through 43 years of age.  Your health care provider may recommend screening at an earlier age if you have risk factors for colon cancer.  Your health care provider may also recommend using home test kits to check for hidden blood in the stool.  A small camera at the end of a tube can be used to examine your colon directly (sigmoidoscopy or colonoscopy). This is done to check for the earliest forms of colorectal cancer.  Routine screening usually begins at   age 50.  Direct examination of the colon should be repeated every 5-10 years through 43 years of age. However, you may need to be screened more often if early forms of precancerous polyps or small growths are found. Skin Cancer  Check your skin from head to toe regularly.  Tell your health care provider about any new moles or changes in moles, especially if there is a change in a mole's shape or color.  Also tell your health care provider if you have a mole that is larger than the size of a pencil eraser.  Always use sunscreen. Apply sunscreen liberally and repeatedly throughout the day.  Protect yourself by wearing long sleeves, pants, a wide-brimmed hat, and sunglasses whenever you are outside. HEART DISEASE, DIABETES, AND HIGH BLOOD PRESSURE   Have your blood pressure checked at least every 1-2 years. High blood pressure causes heart disease and increases the risk of stroke.  If you are between 55 years and 79 years old, ask your health care provider if you should take aspirin to prevent strokes.  Have regular diabetes screenings. This involves taking a blood sample to check  your fasting blood sugar level.  If you are at a normal weight and have a low risk for diabetes, have this test once every three years after 43 years of age.  If you are overweight and have a high risk for diabetes, consider being tested at a younger age or more often. PREVENTING INFECTION  Hepatitis B  If you have a higher risk for hepatitis B, you should be screened for this virus. You are considered at high risk for hepatitis B if:  You were born in a country where hepatitis B is common. Ask your health care provider which countries are considered high risk.  Your parents were born in a high-risk country, and you have not been immunized against hepatitis B (hepatitis B vaccine).  You have HIV or AIDS.  You use needles to inject street drugs.  You live with someone who has hepatitis B.  You have had sex with someone who has hepatitis B.  You get hemodialysis treatment.  You take certain medicines for conditions, including cancer, organ transplantation, and autoimmune conditions. Hepatitis C  Blood testing is recommended for:  Everyone born from 1945 through 1965.  Anyone with known risk factors for hepatitis C. Sexually transmitted infections (STIs)  You should be screened for sexually transmitted infections (STIs) including gonorrhea and chlamydia if:  You are sexually active and are younger than 43 years of age.  You are older than 43 years of age and your health care provider tells you that you are at risk for this type of infection.  Your sexual activity has changed since you were last screened and you are at an increased risk for chlamydia or gonorrhea. Ask your health care provider if you are at risk.  If you do not have HIV, but are at risk, it may be recommended that you take a prescription medicine daily to prevent HIV infection. This is called pre-exposure prophylaxis (PrEP). You are considered at risk if:  You are sexually active and do not regularly use  condoms or know the HIV status of your partner(s).  You take drugs by injection.  You are sexually active with a partner who has HIV. Talk with your health care provider about whether you are at high risk of being infected with HIV. If you choose to begin PrEP, you should first be tested for HIV. You   should then be tested every 3 months for as long as you are taking PrEP.  PREGNANCY   If you are premenopausal and you may become pregnant, ask your health care provider about preconception counseling.  If you may become pregnant, take 400 to 800 micrograms (mcg) of folic acid every day.  If you want to prevent pregnancy, talk to your health care provider about birth control (contraception). OSTEOPOROSIS AND MENOPAUSE   Osteoporosis is a disease in which the bones lose minerals and strength with aging. This can result in serious bone fractures. Your risk for osteoporosis can be identified using a bone density scan.  If you are 65 years of age or older, or if you are at risk for osteoporosis and fractures, ask your health care provider if you should be screened.  Ask your health care provider whether you should take a calcium or vitamin D supplement to lower your risk for osteoporosis.  Menopause may have certain physical symptoms and risks.  Hormone replacement therapy may reduce some of these symptoms and risks. Talk to your health care provider about whether hormone replacement therapy is right for you.  HOME CARE INSTRUCTIONS   Schedule regular health, dental, and eye exams.  Stay current with your immunizations.   Do not use any tobacco products including cigarettes, chewing tobacco, or electronic cigarettes.  If you are pregnant, do not drink alcohol.  If you are breastfeeding, limit how much and how often you drink alcohol.  Limit alcohol intake to no more than 1 drink per day for nonpregnant women. One drink equals 12 ounces of beer, 5 ounces of wine, or 1 ounces of hard  liquor.  Do not use street drugs.  Do not share needles.  Ask your health care provider for help if you need support or information about quitting drugs.  Tell your health care provider if you often feel depressed.  Tell your health care provider if you have ever been abused or do not feel safe at home. Document Released: 06/17/2011 Document Revised: 04/18/2014 Document Reviewed: 11/03/2013 Exit[employer] Patient Information 2015 [employer]. This information is not intended to replace advice given to you by your health care provider. Make sure you discuss any questions you have with your health care provider.  

## 2016-12-24 LAB — URINALYSIS W MICROSCOPIC + REFLEX CULTURE
Bilirubin Urine: NEGATIVE
Casts: NONE SEEN [LPF]
GLUCOSE, UA: NEGATIVE
Hgb urine dipstick: NEGATIVE
Ketones, ur: NEGATIVE
NITRITE: NEGATIVE
PH: 6 (ref 5.0–8.0)
PROTEIN: NEGATIVE
SPECIFIC GRAVITY, URINE: 1.021 (ref 1.001–1.035)
Yeast: NONE SEEN [HPF]

## 2016-12-25 LAB — URINE CULTURE: Organism ID, Bacteria: NO GROWTH

## 2016-12-26 ENCOUNTER — Telehealth: Payer: Self-pay

## 2016-12-26 NOTE — Telephone Encounter (Signed)
Patient called returning call about lab result. See lab result note.

## 2017-04-26 ENCOUNTER — Other Ambulatory Visit: Payer: Self-pay | Admitting: Gynecology

## 2017-04-28 NOTE — Telephone Encounter (Signed)
Per note on 12/23/16 "Patient will call if she decides to reinitiate oral contraceptives"

## 2017-06-06 ENCOUNTER — Other Ambulatory Visit: Payer: Self-pay | Admitting: Gynecology

## 2017-06-06 DIAGNOSIS — Z1231 Encounter for screening mammogram for malignant neoplasm of breast: Secondary | ICD-10-CM

## 2017-06-26 ENCOUNTER — Ambulatory Visit (HOSPITAL_BASED_OUTPATIENT_CLINIC_OR_DEPARTMENT_OTHER): Payer: BC Managed Care – PPO

## 2017-07-14 ENCOUNTER — Ambulatory Visit (HOSPITAL_BASED_OUTPATIENT_CLINIC_OR_DEPARTMENT_OTHER)
Admission: RE | Admit: 2017-07-14 | Discharge: 2017-07-14 | Disposition: A | Discharge status: Home / Self Care | Payer: BC Managed Care – PPO | Source: Ambulatory Visit | Attending: Gynecology | Admitting: Gynecology

## 2017-07-14 ENCOUNTER — Encounter (HOSPITAL_BASED_OUTPATIENT_CLINIC_OR_DEPARTMENT_OTHER): Payer: Self-pay

## 2017-07-14 DIAGNOSIS — Z1231 Encounter for screening mammogram for malignant neoplasm of breast: Secondary | ICD-10-CM | POA: Insufficient documentation

## 2017-10-06 ENCOUNTER — Telehealth: Payer: Self-pay | Admitting: *Deleted

## 2017-10-06 NOTE — Telephone Encounter (Signed)
Pt called c/o painful bump on labia noticed this am, no drainage, no pus in appearance. Pt said discomfort in minor now, asked what to do. I advised to school office visit with provider for exam not able to diagnosis what " bump" could be. Pt will call from desk to schedule.

## 2017-10-08 ENCOUNTER — Ambulatory Visit (INDEPENDENT_AMBULATORY_CARE_PROVIDER_SITE_OTHER): Payer: BC Managed Care – PPO | Admitting: Women's Health

## 2017-10-08 ENCOUNTER — Encounter: Payer: Self-pay | Admitting: Women's Health

## 2017-10-08 VITALS — BP 110/78 | Ht 68.0 in | Wt 131.0 lb

## 2017-10-08 DIAGNOSIS — L739 Follicular disorder, unspecified: Secondary | ICD-10-CM | POA: Diagnosis not present

## 2017-10-08 NOTE — Progress Notes (Signed)
41 year old MWF G2P2 presents to clinic complaining of painful vaginal bump on left labia minora x 5 days that "erupted with pus" yesterday, pain has resolved. Denies fever/chills, abdominal/pelvic pain, urinary symptoms, vaginal itching/discharge/bleeding/odor. 12/2013  Mirena IUD, amenorrheic. Currently on a course of Amoxicillin for facial acne, currently on day 5 of 7. Same partner, negative STD screen. History of vesicovaginal fistula s/p surgical correction. States she wears thongs everyday. No health problems other than acne.  Exam: Appears well, no acute distress. Pelvic exam: External genitalia within normal limits. No visible irritation, erythema, cysts/abscesses, openings in skin. No tenderness to palpation.   Folliculitis, resolved  Plan: Reassured that no current signs of infection on physical exam. Finish Amoxicillin as this is likely why the infection cleared. Counseled on proper hygiene, limit wearing thongs as this can be irritative. Instructed to call if any further problems.

## 2018-01-12 ENCOUNTER — Encounter: Payer: Self-pay | Admitting: Gynecology

## 2018-01-12 ENCOUNTER — Ambulatory Visit: Payer: BC Managed Care – PPO | Admitting: Gynecology

## 2018-01-12 VITALS — BP 116/76 | Ht 67.0 in | Wt 131.0 lb

## 2018-01-12 DIAGNOSIS — Z01419 Encounter for gynecological examination (general) (routine) without abnormal findings: Secondary | ICD-10-CM

## 2018-01-12 DIAGNOSIS — Z30431 Encounter for routine checking of intrauterine contraceptive device: Secondary | ICD-10-CM

## 2018-01-12 DIAGNOSIS — Z1151 Encounter for screening for human papillomavirus (HPV): Secondary | ICD-10-CM | POA: Diagnosis not present

## 2018-01-12 LAB — CBC WITH DIFFERENTIAL/PLATELET
BASOS ABS: 41 {cells}/uL (ref 0–200)
Basophils Relative: 0.6 %
EOS ABS: 69 {cells}/uL (ref 15–500)
Eosinophils Relative: 1 %
HCT: 35.6 % (ref 35.0–45.0)
Hemoglobin: 11.8 g/dL (ref 11.7–15.5)
Lymphs Abs: 1635 cells/uL (ref 850–3900)
MCH: 30.8 pg (ref 27.0–33.0)
MCHC: 33.1 g/dL (ref 32.0–36.0)
MCV: 93 fL (ref 80.0–100.0)
MONOS PCT: 7.9 %
MPV: 10 fL (ref 7.5–12.5)
Neutro Abs: 4609 cells/uL (ref 1500–7800)
Neutrophils Relative %: 66.8 %
Platelets: 232 10*3/uL (ref 140–400)
RBC: 3.83 10*6/uL (ref 3.80–5.10)
RDW: 11.8 % (ref 11.0–15.0)
Total Lymphocyte: 23.7 %
WBC mixed population: 545 cells/uL (ref 200–950)
WBC: 6.9 10*3/uL (ref 3.8–10.8)

## 2018-01-12 LAB — COMPREHENSIVE METABOLIC PANEL
AG Ratio: 1.8 (calc) (ref 1.0–2.5)
ALKALINE PHOSPHATASE (APISO): 34 U/L (ref 33–115)
ALT: 13 U/L (ref 6–29)
AST: 13 U/L (ref 10–30)
Albumin: 4.4 g/dL (ref 3.6–5.1)
BUN: 18 mg/dL (ref 7–25)
CO2: 25 mmol/L (ref 20–32)
Calcium: 9.6 mg/dL (ref 8.6–10.2)
Chloride: 106 mmol/L (ref 98–110)
Creat: 0.75 mg/dL (ref 0.50–1.10)
GLUCOSE: 93 mg/dL (ref 65–99)
Globulin: 2.4 g/dL (calc) (ref 1.9–3.7)
Potassium: 4 mmol/L (ref 3.5–5.3)
Sodium: 138 mmol/L (ref 135–146)
Total Bilirubin: 0.4 mg/dL (ref 0.2–1.2)
Total Protein: 6.8 g/dL (ref 6.1–8.1)

## 2018-01-12 NOTE — Patient Instructions (Signed)
Follow-up to have your IUD replaced in 1 year.  Follow-up for annual exam in 1 year.

## 2018-01-12 NOTE — Addendum Note (Signed)
Addended by: Dayna BarkerGARDNER, Sharaine Delange K on: 01/12/2018 04:07 PM   Modules accepted: Orders

## 2018-01-12 NOTE — Progress Notes (Signed)
    Natalie DresserShawnee Hughes July 29, 1976 [id number]        42 y.o.  G2P2 for annual gynecologic exam.  Without gynecologic complaints  Past medical history,surgical history, problem list, medications, allergies, family history and social history were all reviewed and documented as reviewed in the EPIC chart.  ROS:  Performed with pertinent positives and negatives included in the history, assessment and plan.   Additional significant findings : None   Exam: Kennon PortelaKim Gardner assistant Vitals:   01/12/18 1536  BP: 116/76  Weight: 131 lb (59.4 kg)  Height: 5\' 7"  (1.702 m)   Body mass index is 20.52 kg/m.  General appearance:  Normal affect, orientation and appearance. Skin: Grossly normal HEENT: Without gross lesions.  No cervical or supraclavicular adenopathy. Thyroid normal.  Lungs:  Clear without wheezing, rales or rhonchi Cardiac: RR, without RMG Abdominal:  Soft, nontender, without masses, guarding, rebound, organomegaly or hernia Breasts:  Examined lying and sitting without masses, retractions, discharge or axillary adenopathy. Pelvic:  Ext, BUS, Vagina: Normal  Cervix: Normal.  IUD string palpated  Uterus: Anteverted, normal size, shape and contour, midline and mobile nontender   Adnexa: Without masses or tenderness    Anus and perineum: Normal   Rectovaginal: Normal sphincter tone without palpated masses or tenderness.    Assessment/Plan:  42 y.o. G2P2 female for annual gynecologic exam without menses, Mirena IUD.   1. Mirena IUD 12/2013.  Reminded patient she is due to have this replaced next year at 5-year interval. 2. Had been on oral contraceptives for acne suppression but discontinued them as she did not feel they were helping.  Doing well off of BCPs. 3. Mammography 06/2017.  Continue with annual mammography when due.  Breast exam normal today. 4. Pap smear/HPV 2014.  Pap smear/HPV today.  No history of abnormal Pap smears.  We will continue with every 5-year screening per  current screening guidelines. 5. Health maintenance.  Baseline CBC and CMP done.  Lipid profile normal last year and not repeated.  Follow-up for IUD replacement 1 year.  Follow-up for annual exam the month following her IUD replacement.  Follow-up sooner if any issues.   Dara Lordsimothy P Fontaine MD, 4:03 PM 01/12/2018

## 2018-01-15 LAB — PAP IG AND HPV HIGH-RISK: HPV DNA HIGH RISK: NOT DETECTED

## 2018-06-11 ENCOUNTER — Other Ambulatory Visit: Payer: Self-pay | Admitting: Gynecology

## 2018-06-11 DIAGNOSIS — Z1231 Encounter for screening mammogram for malignant neoplasm of breast: Secondary | ICD-10-CM

## 2018-07-22 ENCOUNTER — Ambulatory Visit (HOSPITAL_BASED_OUTPATIENT_CLINIC_OR_DEPARTMENT_OTHER)
Admission: RE | Admit: 2018-07-22 | Discharge: 2018-07-22 | Disposition: A | Discharge status: Home / Self Care | Payer: BC Managed Care – PPO | Source: Ambulatory Visit | Attending: Gynecology | Admitting: Gynecology

## 2018-07-22 DIAGNOSIS — Z1231 Encounter for screening mammogram for malignant neoplasm of breast: Secondary | ICD-10-CM | POA: Diagnosis not present

## 2019-06-28 ENCOUNTER — Other Ambulatory Visit (HOSPITAL_BASED_OUTPATIENT_CLINIC_OR_DEPARTMENT_OTHER): Payer: Self-pay | Admitting: Gynecology

## 2019-06-28 ENCOUNTER — Other Ambulatory Visit (HOSPITAL_BASED_OUTPATIENT_CLINIC_OR_DEPARTMENT_OTHER): Payer: Self-pay | Admitting: Family Medicine

## 2019-06-28 DIAGNOSIS — Z1231 Encounter for screening mammogram for malignant neoplasm of breast: Secondary | ICD-10-CM

## 2019-07-28 ENCOUNTER — Ambulatory Visit (HOSPITAL_BASED_OUTPATIENT_CLINIC_OR_DEPARTMENT_OTHER): Payer: BC Managed Care – PPO

## 2019-08-04 ENCOUNTER — Ambulatory Visit (HOSPITAL_BASED_OUTPATIENT_CLINIC_OR_DEPARTMENT_OTHER): Payer: BC Managed Care – PPO

## 2019-08-26 ENCOUNTER — Ambulatory Visit (HOSPITAL_BASED_OUTPATIENT_CLINIC_OR_DEPARTMENT_OTHER)
Admission: RE | Admit: 2019-08-26 | Discharge: 2019-08-26 | Disposition: A | Discharge status: Home / Self Care | Payer: BC Managed Care – PPO | Source: Ambulatory Visit | Attending: Gynecology | Admitting: Gynecology

## 2019-08-26 ENCOUNTER — Other Ambulatory Visit: Payer: Self-pay

## 2019-08-26 DIAGNOSIS — Z1231 Encounter for screening mammogram for malignant neoplasm of breast: Secondary | ICD-10-CM | POA: Insufficient documentation

## 2019-09-07 ENCOUNTER — Encounter: Payer: Self-pay | Admitting: Gynecology

## 2020-01-17 ENCOUNTER — Encounter: Payer: BC Managed Care – PPO | Admitting: Women's Health

## 2020-02-14 ENCOUNTER — Ambulatory Visit: Payer: BC Managed Care – PPO | Admitting: Women's Health

## 2020-02-14 ENCOUNTER — Other Ambulatory Visit: Payer: Self-pay

## 2020-02-14 ENCOUNTER — Encounter: Payer: Self-pay | Admitting: Women's Health

## 2020-02-14 VITALS — BP 110/80 | Ht 67.0 in | Wt 133.0 lb

## 2020-02-14 DIAGNOSIS — N912 Amenorrhea, unspecified: Secondary | ICD-10-CM | POA: Diagnosis not present

## 2020-02-14 DIAGNOSIS — Z01419 Encounter for gynecological examination (general) (routine) without abnormal findings: Secondary | ICD-10-CM

## 2020-02-14 LAB — PREGNANCY, URINE: Preg Test, Ur: NEGATIVE

## 2020-02-14 NOTE — Progress Notes (Signed)
[name] 08/17/1976 [id number]    History:    Presents for annual exam.  12/2013 Mirena IUD occasional light cycles.  Normal Pap and mammogram history.  Last here January 2019.  Past medical history, past surgical history, family history and social history were all reviewed and documented in the EPIC chart.  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  2 daughters ages 15 and 10 older 1 has had Gardasil..  ROS:  A ROS was performed and pertinent positives and negatives are included.  Exam:  Vitals:   02/14/20 1458  BP: 110/80  Weight: 133 lb (60.3 kg)  Height: 5\' 7"  (1.702 m)   Body mass index is 20.83 kg/m.   General appearance:  Normal Thyroid:  Symmetrical, normal in size, without palpable masses or nodularity. Respiratory  Auscultation:  Clear without wheezing or rhonchi Cardiovascular  Auscultation:  Regular rate, without rubs, murmurs or gallops  Edema/varicosities:  Not grossly evident Abdominal  Soft,nontender, without masses, guarding or rebound.  Liver/spleen:  No organomegaly noted  Hernia:  None appreciated  Skin  Inspection:  Grossly normal   Breasts: Examined lying and sitting.     Right: Without masses, retractions, discharge or axillary adenopathy.     Left: Without masses, retractions, discharge or axillary adenopathy. Gentitourinary   Inguinal/mons:  Normal without inguinal adenopathy  External genitalia:  Normal  BUS/Urethra/Skene's glands:  Normal  Vagina:  Normal  Cervix:  Normal IUD strings visible  Uterus:   normal in size, shape and contour.  Midline and mobile  Adnexa/parametria:     Rt: Without masses or tenderness.   Lt: Without masses or tenderness.  Anus and perineum: Normal  Digital rectal exam: Normal sphincter tone without palpated masses or tenderness  Assessment/Plan:  44 y.o. MWF G2, P2 for annual exam with no complaints of vaginal discharge, urinary symptoms,  dyspareunia or abdominal pain.  12/2013 Mirena IUD  Plan: Contraception options reviewed, reviewed Mirena IUD overdue for replacement, was surprised had missed 2 years of appointments.  Risks of infection, perforation and hemorrhage reviewed.  Condoms until IUD removed and replaced, UPT negative today.  CBC, CMP, normal lipid panel 12/2017, not fasting today.  SBEs, continue annual 3D screening mammogram, calcium rich foods, vitamin D 2000 IUs daily.  Pap normal 12/2017, new screening guidelines reviewed.    01/2018 [facility], 3:38 PM 02/14/2020

## 2020-02-14 NOTE — Patient Instructions (Signed)
Vit D 2000 iu daily Good to see you!  Health Maintenance, Female Adopting a healthy lifestyle and getting preventive care are important in promoting health and wellness. Ask your health care provider about:  The right schedule for you to have regular tests and exams.  Things you can do on your own to prevent diseases and keep yourself healthy. What should I know about diet, weight, and exercise? Eat a healthy diet   Eat a diet that includes plenty of vegetables, fruits, low-fat dairy products, and lean protein.  Do not eat a lot of foods that are high in solid fats, added sugars, or sodium. Maintain a healthy weight Body mass index (BMI) is used to identify weight problems. It estimates body fat based on height and weight. Your health care provider can help determine your BMI and help you achieve or maintain a healthy weight. Get regular exercise Get regular exercise. This is one of the most important things you can do for your health. Most adults should:  Exercise for at least 150 minutes each week. The exercise should increase your heart rate and make you sweat (moderate-intensity exercise).  Do strengthening exercises at least twice a week. This is in addition to the moderate-intensity exercise.  Spend less time sitting. Even light physical activity can be beneficial. Watch cholesterol and blood lipids Have your blood tested for lipids and cholesterol at 46 years of age, then have this test every 5 years. Have your cholesterol levels checked more often if:  Your lipid or cholesterol levels are high.  You are older than 46 years of age.  You are at high risk for heart disease. What should I know about cancer screening? Depending on your health history and family history, you may need to have cancer screening at various ages. This may include screening for:  Breast cancer.  Cervical cancer.  Colorectal cancer.  Skin cancer.  Lung cancer. What should I know about heart  disease, diabetes, and high blood pressure? Blood pressure and heart disease  High blood pressure causes heart disease and increases the risk of stroke. This is more likely to develop in people who have high blood pressure readings, are of African descent, or are overweight.  Have your blood pressure checked: ? Every 3-5 years if you are 18-39 years of age. ? Every year if you are 40 years old or older. Diabetes Have regular diabetes screenings. This checks your fasting blood sugar level. Have the screening done:  Once every three years after age 40 if you are at a normal weight and have a low risk for diabetes.  More often and at a younger age if you are overweight or have a high risk for diabetes. What should I know about preventing infection? Hepatitis B If you have a higher risk for hepatitis B, you should be screened for this virus. Talk with your health care provider to find out if you are at risk for hepatitis B infection. Hepatitis C Testing is recommended for:  Everyone born from 1945 through 1965.  Anyone with known risk factors for hepatitis C. Sexually transmitted infections (STIs)  Get screened for STIs, including gonorrhea and chlamydia, if: ? You are sexually active and are younger than 46 years of age. ? You are older than 46 years of age and your health care provider tells you that you are at risk for this type of infection. ? Your sexual activity has changed since you were last screened, and you are at increased risk   risk for chlamydia or gonorrhea. Ask your health care provider if you are at risk.  Ask your health care provider about whether you are at high risk for HIV. Your health care provider may recommend a prescription medicine to help prevent HIV infection. If you choose to take medicine to prevent HIV, you should first get tested for HIV. You should then be tested every 3 months for as long as you are taking the medicine. Pregnancy  If you are about to stop  having your period (premenopausal) and you may become pregnant, seek counseling before you get pregnant.  Take 400 to 800 micrograms (mcg) of folic acid every day if you become pregnant.  Ask for birth control (contraception) if you want to prevent pregnancy. Osteoporosis and menopause Osteoporosis is a disease in which the bones lose minerals and strength with aging. This can result in bone fractures. If you are 73 years old or older, or if you are at risk for osteoporosis and fractures, ask your health care provider if you should:  Be screened for bone loss.  Take a calcium or vitamin D supplement to lower your risk of fractures.  Be given hormone replacement therapy (HRT) to treat symptoms of menopause. Follow these instructions at home: Lifestyle  Do not use any products that contain nicotine or tobacco, such as cigarettes, e-cigarettes, and chewing tobacco. If you need help quitting, ask your health care provider.  Do not use street drugs.  Do not share needles.  Ask your health care provider for help if you need support or information about quitting drugs. Alcohol use  Do not drink alcohol if: ? Your health care provider tells you not to drink. ? You are pregnant, may be pregnant, or are planning to become pregnant.  If you drink alcohol: ? Limit how much you use to 0-1 drink a day. ? Limit intake if you are breastfeeding.  Be aware of how much alcohol is in your drink. In the [place] one drink equals one 12 oz bottle of beer (355 mL), one 5 oz glass of wine (148 mL), or one 1 oz glass of hard liquor (44 mL). General instructions  Schedule regular health, dental, and eye exams.  Stay current with your vaccines.  Tell your health care provider if: ? You often feel depressed. ? You have ever been abused or do not feel safe at home. Summary  Adopting a healthy lifestyle and getting preventive care are important in promoting health and wellness.  Follow your health  care provider's instructions about healthy diet, exercising, and getting tested or screened for diseases.  Follow your health care provider's instructions on monitoring your cholesterol and blood pressure. This information is not intended to replace advice given to you by your health care provider. Make sure you discuss any questions you have with your health care provider. Document Revised: 11/25/2018 Document Reviewed: 11/25/2018 Elsevier Patient Education  2020 [employer].

## 2020-02-25 ENCOUNTER — Other Ambulatory Visit: Payer: Self-pay

## 2020-02-28 ENCOUNTER — Ambulatory Visit (INDEPENDENT_AMBULATORY_CARE_PROVIDER_SITE_OTHER): Payer: BC Managed Care – PPO | Admitting: Obstetrics and Gynecology

## 2020-02-28 ENCOUNTER — Encounter: Payer: Self-pay | Admitting: Gynecology

## 2020-02-28 ENCOUNTER — Other Ambulatory Visit: Payer: Self-pay

## 2020-02-28 ENCOUNTER — Ambulatory Visit: Payer: BC Managed Care – PPO | Admitting: Obstetrics and Gynecology

## 2020-02-28 VITALS — BP 118/74

## 2020-02-28 DIAGNOSIS — Z30433 Encounter for removal and reinsertion of intrauterine contraceptive device: Secondary | ICD-10-CM

## 2020-02-28 NOTE — Progress Notes (Signed)
   [name]  June 22, 1976 [id number]  HPI The patient is a 44 y.o. [id number] who presents today for Mirena IUD removal and replacement.  Her IUD was placed in 12/2013 and she missed her annual visit last year which was a surprise to her.  She has infrequent light periods with the IUD.  No other concerns at this time.  Pregnancy test is deferred as the device has been proven to be effective for contraception for up to 6-7 years after insertion.  I counseled her on the Mirena IUD including in the initial period of time after insertion to expect cramping and/or abnormal bleeding, especially in the first 3-6 months use.  After this time period the period can get very light or some women become amenorrheic.  Insertion risks include infection, uterine perforation and device migration into the abdomen which would require surgical removal, or the device can become dislodged causing pain or fall out of which would preclude effective contraception.  BP 118/74   LMP 02/11/2020   Procedure note Mirena IUD removal The cervix was visualized with a speculum.  The IUD strings were seen protruding from the external cervical os and were grasped with a [place] forceps.  The IUD was removed without difficulty and shown to the patient to verify that it was removed in its entirety.  The device was discarded.  Mirena IUD placement The cervix was cleansed with a Betadine swab.  The anterior lip of the cervix was grasped with an Allis clamp.  Endometrial pipelle was used to obtain a sound length of 8.5 cm.  Maintaining sterility, the flange on the IUD insertion tube was set to the appropriate length and the insertion tube was inserted into the cervix and endometrial cavity without difficulty until gentle fundal resistance was met.  The arms of the IUD were deployed and the Mirena was inserted without difficulty. The strings were trimmed to a length of 3 cm.  The patient tolerated the procedure well without any  complication.  Caryn Bee present for the exam and procedure.  The patient will let us know if she has any concerns with the IUD or side effects.  Joseph Pierini MD 02/28/20

## 2020-02-29 ENCOUNTER — Encounter: Payer: Self-pay | Admitting: Gynecology

## 2020-07-28 ENCOUNTER — Other Ambulatory Visit (HOSPITAL_BASED_OUTPATIENT_CLINIC_OR_DEPARTMENT_OTHER): Payer: Self-pay | Admitting: Family Medicine

## 2020-07-28 DIAGNOSIS — Z1231 Encounter for screening mammogram for malignant neoplasm of breast: Secondary | ICD-10-CM

## 2020-08-30 ENCOUNTER — Ambulatory Visit (HOSPITAL_BASED_OUTPATIENT_CLINIC_OR_DEPARTMENT_OTHER): Payer: BC Managed Care – PPO

## 2020-09-11 ENCOUNTER — Ambulatory Visit (HOSPITAL_BASED_OUTPATIENT_CLINIC_OR_DEPARTMENT_OTHER)
Admission: RE | Admit: 2020-09-11 | Discharge: 2020-09-11 | Disposition: A | Discharge status: Home / Self Care | Payer: BC Managed Care – PPO | Source: Ambulatory Visit | Attending: Family Medicine | Admitting: Family Medicine

## 2020-09-11 ENCOUNTER — Encounter (HOSPITAL_BASED_OUTPATIENT_CLINIC_OR_DEPARTMENT_OTHER): Payer: Self-pay

## 2020-09-11 ENCOUNTER — Other Ambulatory Visit: Payer: Self-pay

## 2020-09-11 DIAGNOSIS — Z1231 Encounter for screening mammogram for malignant neoplasm of breast: Secondary | ICD-10-CM

## 2020-09-26 ENCOUNTER — Encounter: Payer: Self-pay | Admitting: Nurse Practitioner

## 2020-09-26 ENCOUNTER — Other Ambulatory Visit: Payer: Self-pay

## 2020-09-26 ENCOUNTER — Ambulatory Visit: Payer: BC Managed Care – PPO | Admitting: Nurse Practitioner

## 2020-09-26 VITALS — BP 120/78

## 2020-09-26 DIAGNOSIS — R350 Frequency of micturition: Secondary | ICD-10-CM | POA: Diagnosis not present

## 2020-09-26 DIAGNOSIS — N3001 Acute cystitis with hematuria: Secondary | ICD-10-CM

## 2020-09-26 LAB — URINALYSIS, COMPLETE W/RFL CULTURE
Bilirubin Urine: NEGATIVE
Ketones, ur: NEGATIVE

## 2020-09-26 MED ORDER — SULFAMETHOXAZOLE-TRIMETHOPRIM 800-160 MG PO TABS: 1.0000 | ORAL_TABLET | Freq: Two times a day (BID) | ORAL | 0 refills | Status: AC

## 2020-09-26 NOTE — Patient Instructions (Signed)
Urinary Tract Infection, Adult A urinary tract infection (UTI) is an infection of any part of the urinary tract. The urinary tract includes:  The kidneys.  The ureters.  The bladder.  The urethra. These organs make, store, and get rid of pee (urine) in the body. What are the causes? This is caused by germs (bacteria) in your genital area. These germs grow and cause swelling (inflammation) of your urinary tract. What increases the risk? You are more likely to develop this condition if:  You have a small, thin tube (catheter) to drain pee.  You cannot control when you pee or poop (incontinence).  You are female, and: ? You use these methods to prevent pregnancy:  A medicine that kills sperm (spermicide).  A device that blocks sperm (diaphragm). ? You have low levels of a female hormone (estrogen). ? You are pregnant.  You have genes that add to your risk.  You are sexually active.  You take antibiotic medicines.  You have trouble peeing because of: ? A prostate that is bigger than normal, if you are female. ? A blockage in the part of your body that drains pee from the bladder (urethra). ? A kidney stone. ? A nerve condition that affects your bladder (neurogenic bladder). ? Not getting enough to drink. ? Not peeing often enough.  You have other conditions, such as: ? Diabetes. ? A weak disease-fighting system (immune system). ? Sickle cell disease. ? Gout. ? Injury of the spine. What are the signs or symptoms? Symptoms of this condition include:  Needing to pee right away (urgently).  Peeing often.  Peeing small amounts often.  Pain or burning when peeing.  Blood in the pee.  Pee that smells bad or not like normal.  Trouble peeing.  Pee that is cloudy.  Fluid coming from the vagina, if you are female.  Pain in the belly or lower back. Other symptoms include:  Throwing up (vomiting).  No urge to eat.  Feeling mixed up (confused).  Being tired  and grouchy (irritable).  A fever.  Watery poop (diarrhea). How is this treated? This condition may be treated with:  Antibiotic medicine.  Other medicines.  Drinking enough water. Follow these instructions at home:  Medicines  Take over-the-counter and prescription medicines only as told by your doctor.  If you were prescribed an antibiotic medicine, take it as told by your doctor. Do not stop taking it even if you start to feel better. General instructions  Make sure you: ? Pee until your bladder is empty. ? Do not hold pee for a long time. ? Empty your bladder after sex. ? Wipe from front to back after pooping if you are a female. Use each tissue one time when you wipe.  Drink enough fluid to keep your pee pale yellow.  Keep all follow-up visits as told by your doctor. This is important. Contact a doctor if:  You do not get better after 1-2 days.  Your symptoms go away and then come back. Get help right away if:  You have very bad back pain.  You have very bad pain in your lower belly.  You have a fever.  You are sick to your stomach (nauseous).  You are throwing up. Summary  A urinary tract infection (UTI) is an infection of any part of the urinary tract.  This condition is caused by germs in your genital area.  There are many risk factors for a UTI. These include having a small, thin   tube to drain pee and not being able to control when you pee or poop.  Treatment includes antibiotic medicines for germs.  Drink enough fluid to keep your pee pale yellow. This information is not intended to replace advice given to you by your health care provider. Make sure you discuss any questions you have with your health care provider. Document Revised: 11/19/2018 Document Reviewed: 06/11/2018 Elsevier Patient Education  2020 Elsevier Inc.  

## 2020-09-26 NOTE — Progress Notes (Signed)
   Acute Office Visit  Subjective:    Patient ID: [name], female    DOB: August 06, 1976, 44 y.o.   MRN: [id number]   HPI 44 y.o. presents today for urinary frequency and lower abdominal pressure. She was treated for UTI 9/15 and 9/23 at urgent care with Nitrofurantoin and then again recently with Cipro. Her last dose of Cipro was 4 days ago. Frequency will improve while taking the antibiotic and then return within a couple of days. Prior to this she had not had a UTI in over 10 years. Ureter stent placed during emergency c-section in 2009, otherwise negative urological history. Denies dysuria, urgency, or hematuria. Denies vaginal symptoms.    Review of Systems  Constitutional: Negative.   Gastrointestinal: Negative.   Genitourinary: Positive for frequency. Negative for dysuria, hematuria, urgency and vaginal discharge.       Objective:    Physical Exam Constitutional:      Appearance: Normal appearance.  Abdominal:     Tenderness: There is no right CVA tenderness or left CVA tenderness.     BP 120/78 (BP Location: Right Arm, Patient Position: Sitting, Cuff Size: Normal)   LMP  (LMP Unknown)  Wt Readings from Last 3 Encounters:  02/14/20 133 lb (60.3 kg)  01/12/18 131 lb (59.4 kg)  10/08/17 131 lb (59.4 kg)   UA 3+leukocytes, 1+ blood, wbc 40-50, rbc 3-10, many bacteria     Assessment & Plan:   Problem List Items Addressed This Visit    None    Visit Diagnoses    Acute cystitis with hematuria    -  Primary   Relevant Medications   sulfamethoxazole-trimethoprim (BACTRIM DS) 800-160 MG tablet   Urinary frequency       Relevant Orders   Urinalysis,Complete w/RFL Culture      Plan: UA and symptoms consistent with UTI. Bactrim 800-160 mg twice a day for 7 days. Take full course of antibiotic even if symptoms improve and take with food. Increase water intake. Culture pending and we will triage based on results. Return if symptoms worse or do not improve. She is  agreeable to plan.     [name] [facility], 3:30 PM 09/26/2020

## 2020-09-27 LAB — URINALYSIS, COMPLETE W/RFL CULTURE
Glucose, UA: NEGATIVE
Hyaline Cast: NONE SEEN /LPF
Nitrites, Initial: NEGATIVE
Protein, ur: NEGATIVE
Specific Gravity, Urine: 1.015 (ref 1.001–1.03)
pH: 7 (ref 5.0–8.0)

## 2020-09-27 LAB — URINE CULTURE
MICRO NUMBER:: 11061202
Result:: NO GROWTH
SPECIMEN QUALITY:: ADEQUATE

## 2020-09-27 LAB — CULTURE INDICATED

## 2020-09-28 ENCOUNTER — Telehealth: Payer: Self-pay | Admitting: *Deleted

## 2020-09-28 NOTE — Telephone Encounter (Signed)
-----   Message from [name], NP sent at 09/28/2020  8:34 AM EDT ----- Please send referral to urology for evaluation of interstitial cystitis and urodynamic testing.

## 2020-09-28 NOTE — Telephone Encounter (Signed)
Referral faxed to [facility] and placed in [facility]. They will call to schedule. Tiffany sent message to patient about referral.

## 2020-10-04 NOTE — Telephone Encounter (Signed)
Patient saw Dr.Pace at urology today.

## 2021-02-14 ENCOUNTER — Encounter: Payer: BC Managed Care – PPO | Admitting: Nurse Practitioner

## 2021-02-21 ENCOUNTER — Ambulatory Visit: Payer: BC Managed Care – PPO | Admitting: Nurse Practitioner

## 2021-02-28 ENCOUNTER — Other Ambulatory Visit: Payer: Self-pay

## 2021-02-28 ENCOUNTER — Encounter: Payer: Self-pay | Admitting: Nurse Practitioner

## 2021-02-28 ENCOUNTER — Ambulatory Visit: Payer: BC Managed Care – PPO | Admitting: Nurse Practitioner

## 2021-02-28 VITALS — BP 110/76 | HR 66 | Resp 12 | Ht 67.0 in | Wt 136.0 lb

## 2021-02-28 DIAGNOSIS — Z01419 Encounter for gynecological examination (general) (routine) without abnormal findings: Secondary | ICD-10-CM

## 2021-02-28 DIAGNOSIS — Z30431 Encounter for routine checking of intrauterine contraceptive device: Secondary | ICD-10-CM | POA: Diagnosis not present

## 2021-02-28 LAB — CBC WITH DIFFERENTIAL/PLATELET
Absolute Monocytes: 458 cells/uL (ref 200–950)
Basophils Absolute: 58 cells/uL (ref 0–200)
Basophils Relative: 1 %
Eosinophils Absolute: 81 cells/uL (ref 15–500)
Eosinophils Relative: 1.4 %
HCT: 38.8 % (ref 35.0–45.0)
Hemoglobin: 13 g/dL (ref 11.7–15.5)
Lymphs Abs: 1885 cells/uL (ref 850–3900)
MCH: 31.3 pg (ref 27.0–33.0)
MCHC: 33.5 g/dL (ref 32.0–36.0)
MCV: 93.3 fL (ref 80.0–100.0)
MPV: 9.4 fL (ref 7.5–12.5)
Monocytes Relative: 7.9 %
Neutro Abs: 3318 cells/uL (ref 1500–7800)
Neutrophils Relative %: 57.2 %
Platelets: 244 10*3/uL (ref 140–400)
RBC: 4.16 10*6/uL (ref 3.80–5.10)
RDW: 11.4 % (ref 11.0–15.0)
Total Lymphocyte: 32.5 %
WBC: 5.8 10*3/uL (ref 3.8–10.8)

## 2021-02-28 LAB — COMPREHENSIVE METABOLIC PANEL
AG Ratio: 1.8 (calc) (ref 1.0–2.5)
ALT: 9 U/L (ref 6–29)
AST: 13 U/L (ref 10–35)
Albumin: 4.6 g/dL (ref 3.6–5.1)
Alkaline phosphatase (APISO): 32 U/L (ref 31–125)
BUN: 13 mg/dL (ref 7–25)
CO2: 25 mmol/L (ref 20–32)
Calcium: 9.6 mg/dL (ref 8.6–10.2)
Chloride: 104 mmol/L (ref 98–110)
Creat: 0.71 mg/dL (ref 0.50–1.10)
Globulin: 2.5 g/dL (calc) (ref 1.9–3.7)
Glucose, Bld: 86 mg/dL (ref 65–99)
Potassium: 3.6 mmol/L (ref 3.5–5.3)
Sodium: 137 mmol/L (ref 135–146)
Total Bilirubin: 0.4 mg/dL (ref 0.2–1.2)
Total Protein: 7.1 g/dL (ref 6.1–8.1)

## 2021-02-28 NOTE — Progress Notes (Signed)
   [name] Jun 04, 1976 [id number]   History:  45 y.o. G2P0002 presents for annual exam without GYN complaints. Amenorrheic/Mirena IUD inserted 02/2020. Normal pap and mammogram history. Is seeing urology for recurrent urinary symptoms without presence of infection. Her symptoms have improved with pelvic floor PT and bladder training.   Gynecologic History No LMP recorded. (Menstrual status: IUD).   Contraception/Family planning: IUD  Health Maintenance Last Pap: 12/2017. Results were: normal Last mammogram: 08/2020. Results were: normal Last colonoscopy: Never Last Dexa: N/A  Past medical history, past surgical history, family history and social history were all reviewed and documented in the EPIC chart.  ROS:  A ROS was performed and pertinent positives and negatives are included.  Exam:  Vitals:   02/28/21 1609  BP: 110/76  Pulse: 66  Resp: 12  Weight: 136 lb (61.7 kg)  Height: 5\' 7"  (1.702 m)   Body mass index is 21.3 kg/m.  General appearance:  Normal Thyroid:  Symmetrical, normal in size, without palpable masses or nodularity. Respiratory  Auscultation:  Clear without wheezing or rhonchi Cardiovascular  Auscultation:  Regular rate, without rubs, murmurs or gallops  Edema/varicosities:  Not grossly evident Abdominal  Soft,nontender, without masses, guarding or rebound.  Liver/spleen:  No organomegaly noted  Hernia:  None appreciated  Skin  Inspection:  Grossly normal   Breasts: Examined lying and sitting.   Right: Without masses, retractions, discharge or axillary adenopathy.   Left: Without masses, retractions, discharge or axillary adenopathy. Gentitourinary   Inguinal/mons:  Normal without inguinal adenopathy  External genitalia:  Normal  BUS/Urethra/Skene's glands:  Normal  Vagina:  Normal  Cervix:  Normal, IUD string present at endocervix  Uterus:  Normal in size, shape and contour.  Midline and mobile  Adnexa/parametria:     Rt: Without  masses or tenderness.   Lt: Without masses or tenderness.  Anus and perineum: Normal  Digital rectal exam: Normal sphincter tone without palpated masses or tenderness  Assessment/Plan:  45 y.o. G2P0002 for annual exam.   Well female exam with routine gynecological exam - Plan: CBC with Differential/Platelet, Comprehensive metabolic panel. Education provided on SBEs, importance of preventative screenings, current guidelines, high calcium diet, regular exercise, and multivitamin daily.   Encounter for routine checking of intrauterine contraceptive device (IUD) - Amenorrheic. Mirena IUD inserted 02/2020. Strings visible during exam.   Screening for cervical cancer - Normal Pap history.  Will repeat at 5-year interval per guidelines.   Screening for breast cancer - Normal mammogram history.  Continue annual screenings.  Normal breast exam today.  Return in 1 year for annual.       03/2020 DNP, 4:29 PM 02/28/2021

## 2021-02-28 NOTE — Patient Instructions (Signed)
Health Maintenance, Female Adopting a healthy lifestyle and getting preventive care are important in promoting health and wellness. Ask your health care provider about:  The right schedule for you to have regular tests and exams.  Things you can do on your own to prevent diseases and keep yourself healthy. What should I know about diet, weight, and exercise? Eat a healthy diet  Eat a diet that includes plenty of vegetables, fruits, low-fat dairy products, and lean protein.  Do not eat a lot of foods that are high in solid fats, added sugars, or sodium.   Maintain a healthy weight Body mass index (BMI) is used to identify weight problems. It estimates body fat based on height and weight. Your health care provider can help determine your BMI and help you achieve or maintain a healthy weight. Get regular exercise Get regular exercise. This is one of the most important things you can do for your health. Most adults should:  Exercise for at least 150 minutes each week. The exercise should increase your heart rate and make you sweat (moderate-intensity exercise).  Do strengthening exercises at least twice a week. This is in addition to the moderate-intensity exercise.  Spend less time sitting. Even light physical activity can be beneficial. Watch cholesterol and blood lipids Have your blood tested for lipids and cholesterol at 47 years of age, then have this test every 5 years. Have your cholesterol levels checked more often if:  Your lipid or cholesterol levels are high.  You are older than 47 years of age.  You are at high risk for heart disease. What should I know about cancer screening? Depending on your health history and family history, you may need to have cancer screening at various ages. This may include screening for:  Breast cancer.  Cervical cancer.  Colorectal cancer.  Skin cancer.  Lung cancer. What should I know about heart disease, diabetes, and high blood  pressure? Blood pressure and heart disease  High blood pressure causes heart disease and increases the risk of stroke. This is more likely to develop in people who have high blood pressure readings, are of African descent, or are overweight.  Have your blood pressure checked: ? Every 3-5 years if you are 18-39 years of age. ? Every year if you are 40 years old or older. Diabetes Have regular diabetes screenings. This checks your fasting blood sugar level. Have the screening done:  Once every three years after age 40 if you are at a normal weight and have a low risk for diabetes.  More often and at a younger age if you are overweight or have a high risk for diabetes. What should I know about preventing infection? Hepatitis B If you have a higher risk for hepatitis B, you should be screened for this virus. Talk with your health care provider to find out if you are at risk for hepatitis B infection. Hepatitis C Testing is recommended for:  Everyone born from 1945 through 1965.  Anyone with known risk factors for hepatitis C. Sexually transmitted infections (STIs)  Get screened for STIs, including gonorrhea and chlamydia, if: ? You are sexually active and are younger than 47 years of age. ? You are older than 47 years of age and your health care provider tells you that you are at risk for this type of infection. ? Your sexual activity has changed since you were last screened, and you are at increased risk for chlamydia or gonorrhea. Ask your health care provider   if you are at risk.  Ask your health care provider about whether you are at high risk for HIV. Your health care provider may recommend a prescription medicine to help prevent HIV infection. If you choose to take medicine to prevent HIV, you should first get tested for HIV. You should then be tested every 3 months for as long as you are taking the medicine. Pregnancy  If you are about to stop having your period (premenopausal) and  you may become pregnant, seek counseling before you get pregnant.  Take 400 to 800 micrograms (mcg) of folic acid every day if you become pregnant.  Ask for birth control (contraception) if you want to prevent pregnancy. Osteoporosis and menopause Osteoporosis is a disease in which the bones lose minerals and strength with aging. This can result in bone fractures. If you are 65 years old or older, or if you are at risk for osteoporosis and fractures, ask your health care provider if you should:  Be screened for bone loss.  Take a calcium or vitamin D supplement to lower your risk of fractures.  Be given hormone replacement therapy (HRT) to treat symptoms of menopause. Follow these instructions at home: Lifestyle  Do not use any products that contain nicotine or tobacco, such as cigarettes, e-cigarettes, and chewing tobacco. If you need help quitting, ask your health care provider.  Do not use street drugs.  Do not share needles.  Ask your health care provider for help if you need support or information about quitting drugs. Alcohol use  Do not drink alcohol if: ? Your health care provider tells you not to drink. ? You are pregnant, may be pregnant, or are planning to become pregnant.  If you drink alcohol: ? Limit how much you use to 0-1 drink a day. ? Limit intake if you are breastfeeding.  Be aware of how much alcohol is in your drink. In the [place] one drink equals one 12 oz bottle of beer (355 mL), one 5 oz glass of wine (148 mL), or one 1 oz glass of hard liquor (44 mL). General instructions  Schedule regular health, dental, and eye exams.  Stay current with your vaccines.  Tell your health care provider if: ? You often feel depressed. ? You have ever been abused or do not feel safe at home. Summary  Adopting a healthy lifestyle and getting preventive care are important in promoting health and wellness.  Follow your health care provider's instructions about healthy  diet, exercising, and getting tested or screened for diseases.  Follow your health care provider's instructions on monitoring your cholesterol and blood pressure. This information is not intended to replace advice given to you by your health care provider. Make sure you discuss any questions you have with your health care provider. Document Revised: 11/25/2018 Document Reviewed: 11/25/2018 Elsevier Patient Education  2021 [employer]  

## 2021-08-17 ENCOUNTER — Other Ambulatory Visit (HOSPITAL_BASED_OUTPATIENT_CLINIC_OR_DEPARTMENT_OTHER): Payer: Self-pay | Admitting: Nurse Practitioner

## 2021-08-17 DIAGNOSIS — Z1231 Encounter for screening mammogram for malignant neoplasm of breast: Secondary | ICD-10-CM

## 2021-09-18 ENCOUNTER — Encounter (HOSPITAL_BASED_OUTPATIENT_CLINIC_OR_DEPARTMENT_OTHER): Payer: Self-pay

## 2021-09-18 ENCOUNTER — Other Ambulatory Visit: Payer: Self-pay

## 2021-09-18 ENCOUNTER — Ambulatory Visit (HOSPITAL_BASED_OUTPATIENT_CLINIC_OR_DEPARTMENT_OTHER)
Admission: RE | Admit: 2021-09-18 | Discharge: 2021-09-18 | Disposition: A | Discharge status: Home / Self Care | Payer: BC Managed Care – PPO | Source: Ambulatory Visit | Attending: Nurse Practitioner | Admitting: Nurse Practitioner

## 2021-09-18 DIAGNOSIS — Z1231 Encounter for screening mammogram for malignant neoplasm of breast: Secondary | ICD-10-CM | POA: Insufficient documentation

## 2022-03-06 ENCOUNTER — Ambulatory Visit: Payer: BC Managed Care – PPO | Admitting: Nurse Practitioner

## 2022-03-14 ENCOUNTER — Ambulatory Visit: Payer: BC Managed Care – PPO | Admitting: Nurse Practitioner

## 2022-03-20 ENCOUNTER — Encounter: Payer: Self-pay | Admitting: Nurse Practitioner

## 2022-03-20 ENCOUNTER — Ambulatory Visit (INDEPENDENT_AMBULATORY_CARE_PROVIDER_SITE_OTHER): Payer: BC Managed Care – PPO | Admitting: Nurse Practitioner

## 2022-03-20 VITALS — BP 118/76 | Ht 67.0 in | Wt 140.0 lb

## 2022-03-20 DIAGNOSIS — Z01419 Encounter for gynecological examination (general) (routine) without abnormal findings: Secondary | ICD-10-CM | POA: Diagnosis not present

## 2022-03-20 DIAGNOSIS — Z30431 Encounter for routine checking of intrauterine contraceptive device: Secondary | ICD-10-CM

## 2022-03-20 DIAGNOSIS — Z8262 Family history of osteoporosis: Secondary | ICD-10-CM | POA: Diagnosis not present

## 2022-03-20 NOTE — Progress Notes (Signed)
? ?  [name] [birth year] [id number] ? ? ?History:  46 y.o. G2P0002 presents for annual exam without GYN complaints. Amenorrheic/Mirena IUD inserted 02/2020. Normal pap and mammogram history. ? ?Gynecologic History ?No LMP recorded. (Menstrual status: IUD). ?  ?Contraception/Family planning: IUD ?Sexually active: Yes ? ?Health Maintenance ?Last Pap: 01/12/2018. Results were: Normal, 5-year repeat ?Last mammogram: 09/18/2021. Results were: Normal ?Last colonoscopy: Never ?Last Dexa: Not indicated ? ?Past medical history, past surgical history, family history and social history were all reviewed and documented in the EPIC chart. 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 Mother and MGM with osteoporosis.  ? ?ROS:  A ROS was performed and pertinent positives and negatives are included. ? ?Exam: ? ?Vitals:  ? 03/20/22 1127  ?BP: 118/76  ?Weight: 140 lb (63.5 kg)  ?Height: 5\' 7"  (1.702 m)  ? ? ?Body mass index is 21.93 kg/m?. ? ?General appearance:  Normal ?Thyroid:  Symmetrical, normal in size, without palpable masses or nodularity. ?Respiratory ? Auscultation:  Clear without wheezing or rhonchi ?Cardiovascular ? Auscultation:  Regular rate, without rubs, murmurs or gallops ? Edema/varicosities:  Not grossly evident ?Abdominal ? Soft,nontender, without masses, guarding or rebound. ? Liver/spleen:  No organomegaly noted ? Hernia:  None appreciated ? Skin ? Inspection:  Grossly normal ?  ?Breasts: Examined lying and sitting.  ? Right: Without masses, retractions, discharge or axillary adenopathy. ? ? Left: Without masses, retractions, discharge or axillary adenopathy. ?Gentitourinary  ? Inguinal/mons:  Normal without inguinal adenopathy ? External genitalia:  Normal ? BUS/Urethra/Skene's glands:  Normal ? Vagina:  Normal ? Cervix:  Normal, IUD string visible ? Uterus:  Normal in size, shape and contour.  Midline and mobile ? Adnexa/parametria:   ?  Rt: Without masses or tenderness. ?  Lt: Without  masses or tenderness. ? Anus and perineum: Normal ? Digital rectal exam: Normal sphincter tone without palpated masses or tenderness ? ?Assessment/Plan:  46 y.o. G2P0002 for annual exam.  ? ?Well female exam with routine gynecological exam - Plan: CBC with Differential/Platelet, Comprehensive metabolic panel, Lipid panel, TSH. Education provided on SBEs, importance of preventative screenings, current guidelines, high calcium diet, regular exercise, and multivitamin daily.  Will return for fasting labs.  ? ?Family history of osteoporosis - mother and grandmother with osteoporosis. Will plan DXA at age 50. Recommend multivitamin and regular exercise.  ? ?Encounter for routine checking of intrauterine contraceptive device (IUD) - Amenorrheic. Mirena IUD inserted 02/2020. Strings visible during exam. She is aware of 8-year FDA approval.  ? ?Screening for cervical cancer - Normal Pap history.  Will repeat at 5-year interval per guidelines.  ? ?Screening for breast cancer - Normal mammogram history.  Continue annual screenings.  Normal breast exam today. ? ?Screening for colon cancer - Has not had screenings. Discussed current guidelines and importance of preventative screenings. She plans to schedule colonoscopy this summer.  ? ?Return in 1 year for annual.  ? ? ? ? ? ?03/2020 DNP, 11:50 AM 03/20/2022 ? ?

## 2022-03-20 NOTE — Patient Instructions (Signed)
Schedule Colonoscopy! ?[facility] ?[phone] ?[address] [place], [place] [address] ? ?

## 2022-03-21 ENCOUNTER — Other Ambulatory Visit: Payer: BC Managed Care – PPO

## 2022-03-21 DIAGNOSIS — Z01419 Encounter for gynecological examination (general) (routine) without abnormal findings: Secondary | ICD-10-CM

## 2022-03-22 LAB — LIPID PANEL
Cholesterol: 165 mg/dL (ref <200)
HDL: 64 mg/dL (ref >=50)
LDL Cholesterol (Calc): 87 mg/dL (calc)
Non-HDL Cholesterol (Calc): 101 mg/dL (calc) (ref <130)
Total CHOL/HDL Ratio: 2.6 (calc) (ref <5.0)
Triglycerides: 56 mg/dL (ref <150)

## 2022-03-22 LAB — CBC WITH DIFFERENTIAL/PLATELET
Absolute Monocytes: 460 cells/uL (ref 200–950)
Basophils Absolute: 50 cells/uL (ref 0–200)
Basophils Relative: 0.8 %
Eosinophils Absolute: 120 cells/uL (ref 15–500)
Eosinophils Relative: 1.9 %
HCT: 37 % (ref 35.0–45.0)
Hemoglobin: 12.1 g/dL (ref 11.7–15.5)
Lymphs Abs: 1380 cells/uL (ref 850–3900)
MCH: 31.1 pg (ref 27.0–33.0)
MCHC: 32.7 g/dL (ref 32.0–36.0)
MCV: 95.1 fL (ref 80.0–100.0)
MPV: 9.9 fL (ref 7.5–12.5)
Monocytes Relative: 7.3 %
Neutro Abs: 4290 cells/uL (ref 1500–7800)
Neutrophils Relative %: 68.1 %
Platelets: 237 10*3/uL (ref 140–400)
RBC: 3.89 10*6/uL (ref 3.80–5.10)
RDW: 11.7 % (ref 11.0–15.0)
Total Lymphocyte: 21.9 %
WBC: 6.3 10*3/uL (ref 3.8–10.8)

## 2022-03-22 LAB — COMPREHENSIVE METABOLIC PANEL
AG Ratio: 1.6 (calc) (ref 1.0–2.5)
ALT: 7 U/L (ref 6–29)
AST: 14 U/L (ref 10–35)
Albumin: 4.1 g/dL (ref 3.6–5.1)
Alkaline phosphatase (APISO): 32 U/L (ref 31–125)
BUN: 12 mg/dL (ref 7–25)
CO2: 23 mmol/L (ref 20–32)
Calcium: 9.2 mg/dL (ref 8.6–10.2)
Chloride: 107 mmol/L (ref 98–110)
Creat: 0.8 mg/dL (ref 0.50–0.99)
Globulin: 2.6 g/dL (calc) (ref 1.9–3.7)
Glucose, Bld: 82 mg/dL (ref 65–99)
Potassium: 4.5 mmol/L (ref 3.5–5.3)
Sodium: 139 mmol/L (ref 135–146)
Total Bilirubin: 0.6 mg/dL (ref 0.2–1.2)
Total Protein: 6.7 g/dL (ref 6.1–8.1)

## 2022-03-22 LAB — TSH: TSH: 2.61 mIU/L

## 2022-08-20 ENCOUNTER — Other Ambulatory Visit (HOSPITAL_BASED_OUTPATIENT_CLINIC_OR_DEPARTMENT_OTHER): Payer: Self-pay | Admitting: Family Medicine

## 2022-08-20 DIAGNOSIS — Z1231 Encounter for screening mammogram for malignant neoplasm of breast: Secondary | ICD-10-CM

## 2022-08-26 ENCOUNTER — Ambulatory Visit (HOSPITAL_BASED_OUTPATIENT_CLINIC_OR_DEPARTMENT_OTHER): Payer: BC Managed Care – PPO

## 2022-09-25 ENCOUNTER — Ambulatory Visit (HOSPITAL_BASED_OUTPATIENT_CLINIC_OR_DEPARTMENT_OTHER): Payer: BC Managed Care – PPO

## 2022-10-01 ENCOUNTER — Ambulatory Visit (HOSPITAL_BASED_OUTPATIENT_CLINIC_OR_DEPARTMENT_OTHER)
Admission: RE | Admit: 2022-10-01 | Discharge: 2022-10-01 | Disposition: A | Discharge status: Home / Self Care | Payer: BC Managed Care – PPO | Source: Ambulatory Visit | Attending: Family Medicine | Admitting: Family Medicine

## 2022-10-01 ENCOUNTER — Encounter (HOSPITAL_BASED_OUTPATIENT_CLINIC_OR_DEPARTMENT_OTHER): Payer: Self-pay

## 2022-10-01 DIAGNOSIS — Z1231 Encounter for screening mammogram for malignant neoplasm of breast: Secondary | ICD-10-CM | POA: Insufficient documentation

## 2022-10-04 ENCOUNTER — Other Ambulatory Visit: Payer: Self-pay | Admitting: Family Medicine

## 2022-10-04 DIAGNOSIS — R928 Other abnormal and inconclusive findings on diagnostic imaging of breast: Secondary | ICD-10-CM

## 2022-12-02 ENCOUNTER — Other Ambulatory Visit: Payer: Self-pay | Admitting: Nurse Practitioner

## 2022-12-02 ENCOUNTER — Ambulatory Visit
Admission: RE | Admit: 2022-12-02 | Discharge: 2022-12-02 | Disposition: A | Discharge status: Home / Self Care | Payer: BC Managed Care – PPO | Source: Ambulatory Visit | Attending: Family Medicine | Admitting: Family Medicine

## 2022-12-02 DIAGNOSIS — R928 Other abnormal and inconclusive findings on diagnostic imaging of breast: Secondary | ICD-10-CM

## 2023-02-28 ENCOUNTER — Ambulatory Visit: Payer: BC Managed Care – PPO | Admitting: Radiology

## 2023-02-28 VITALS — BP 106/64

## 2023-02-28 DIAGNOSIS — R350 Frequency of micturition: Secondary | ICD-10-CM | POA: Diagnosis not present

## 2023-02-28 DIAGNOSIS — N760 Acute vaginitis: Secondary | ICD-10-CM

## 2023-02-28 DIAGNOSIS — N898 Other specified noninflammatory disorders of vagina: Secondary | ICD-10-CM

## 2023-02-28 LAB — WET PREP FOR TRICH, YEAST, CLUE

## 2023-02-28 MED ORDER — NITROFURANTOIN MONOHYD MACRO 100 MG PO CAPS: 100.0000 mg | ORAL_CAPSULE | Freq: Two times a day (BID) | ORAL | 0 refills | Status: DC

## 2023-02-28 NOTE — Progress Notes (Signed)
      SUBJECTIVE: [name] is a 47 y.o. female who complains of urinary frequency, odor in urine, urgency, no dysuria, vaginal discharge, no itching or odor. Symptoms x's 3 weeks.    Allergies  Allergen Reactions   Cephalexin Rash    No current outpatient medications on file prior to visit.   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  Vital signs are normal. The abdomen is soft without tenderness, guarding, mass, rebound or organomegaly. No CVA tenderness or inguinal adenopathy noted. Vaginal normal color with scant white d/c. Wet prep sample obtained. Cervix without lesions or discharge. No adnexal masses or tenderness.  Urine dipstick shows positive for RBC's, positive for nitrates, and positive for leukocytes.  Micro exam: 6-10 WBC's per HPF and many+ bacteria.   Microscopic wet-mount exam shows negative for pathogens, normal epithelial cells.   Blood pressure 106/64.    Chaperone offered and declined for exam.  ASSESSMENT/PLAN: 1. Urinary frequency - Urinalysis,Complete w/RFL Culture - nitrofurantoin, macrocrystal-monohydrate, (MACROBID) 100 MG capsule; Take 1 capsule (100 mg total) by mouth 2 (two) times daily.  Dispense: 14 capsule; Refill: 0  2. Vaginal discharge - WET PREP FOR [place], YEAST, CLUE   Will send urine culture and sensitivity.  Treatment per orders - also push fluids, avoid bladder irritants. Instructed she may use Pyridium OTC prn. Call or return to clinic prn if these symptoms worsen or fail to improve as anticipated. Pyelo precautions reviewed with patient.   Rubbie Battiest B WHNP-BC 2:54 PM 02/28/2023

## 2023-03-03 LAB — URINE CULTURE
MICRO NUMBER:: 14698519
SPECIMEN QUALITY:: ADEQUATE

## 2023-03-03 LAB — URINALYSIS, COMPLETE W/RFL CULTURE
Bilirubin Urine: NEGATIVE
Glucose, UA: NEGATIVE
Hyaline Cast: NONE SEEN /LPF
Ketones, ur: NEGATIVE
Nitrites, Initial: POSITIVE — AB
Protein, ur: NEGATIVE
RBC / HPF: NONE SEEN /HPF (ref 0–2)
Specific Gravity, Urine: 1.015 (ref 1.001–1.035)
pH: 7 (ref 5.0–8.0)

## 2023-03-03 LAB — CULTURE INDICATED

## 2023-03-12 ENCOUNTER — Telehealth: Payer: Self-pay

## 2023-03-12 ENCOUNTER — Other Ambulatory Visit: Payer: Self-pay

## 2023-03-12 DIAGNOSIS — R350 Frequency of micturition: Secondary | ICD-10-CM

## 2023-03-12 DIAGNOSIS — R399 Unspecified symptoms and signs involving the genitourinary system: Secondary | ICD-10-CM

## 2023-03-12 NOTE — Telephone Encounter (Signed)
Thank you, Kathy

## 2023-03-12 NOTE — Telephone Encounter (Signed)
Could we please bring her in for a lab visit for u/a today or tomorrow? Thanks

## 2023-03-12 NOTE — Telephone Encounter (Signed)
Diagnosed with UTI 02/28/23 "+ e coli and strep a in urine culture, finish macrobid "  Finished all of the Macrobid Rx. Today called because she does not feel her sx have resolved. C/o: Lower back pain Frequent urination Urine has distinct odor No apparent fever

## 2023-03-12 NOTE — Telephone Encounter (Signed)
Spoke with patient and advised. Unable to come today due to work til 4:30pm.  Scheduled lab appt for 8:30am tomorrow morning. Order placed for u/a with reflex to ur culture.

## 2023-03-13 ENCOUNTER — Other Ambulatory Visit: Payer: Self-pay

## 2023-03-13 ENCOUNTER — Other Ambulatory Visit: Payer: BC Managed Care – PPO

## 2023-03-13 DIAGNOSIS — R350 Frequency of micturition: Secondary | ICD-10-CM

## 2023-03-13 DIAGNOSIS — R399 Unspecified symptoms and signs involving the genitourinary system: Secondary | ICD-10-CM

## 2023-03-13 MED ORDER — SULFAMETHOXAZOLE-TRIMETHOPRIM 800-160 MG PO TABS: 1.0000 | ORAL_TABLET | Freq: Two times a day (BID) | ORAL | 0 refills | Status: DC

## 2023-03-16 LAB — URINALYSIS, COMPLETE W/RFL CULTURE
Bilirubin Urine: NEGATIVE
Glucose, UA: NEGATIVE
Hyaline Cast: NONE SEEN /LPF
Ketones, ur: NEGATIVE
Nitrites, Initial: POSITIVE — AB
Protein, ur: NEGATIVE
Specific Gravity, Urine: 1.01 (ref 1.001–1.035)
pH: 6.5 (ref 5.0–8.0)

## 2023-03-16 LAB — URINE CULTURE
MICRO NUMBER:: 14754499
SPECIMEN QUALITY:: ADEQUATE

## 2023-03-16 LAB — CULTURE INDICATED

## 2023-03-31 ENCOUNTER — Other Ambulatory Visit: Payer: BC Managed Care – PPO

## 2023-03-31 ENCOUNTER — Telehealth: Payer: Self-pay

## 2023-03-31 ENCOUNTER — Other Ambulatory Visit: Payer: Self-pay

## 2023-03-31 DIAGNOSIS — R399 Unspecified symptoms and signs involving the genitourinary system: Secondary | ICD-10-CM

## 2023-03-31 DIAGNOSIS — R3915 Urgency of urination: Secondary | ICD-10-CM

## 2023-03-31 NOTE — Telephone Encounter (Signed)
Needs urinalysis. If positive culture she will need referral to urology.

## 2023-03-31 NOTE — Telephone Encounter (Signed)
Patient with history of persistant UTI with pos culture 02/28/23 (e. Coli, strept agalactiae) treated with Macrobid and then again pos culture on 03/13/23 with same bacteria and treated with Septra DS x 7 days.  She called today because sx persist. C/o lower back pain, urine has "sweet odor", urgency.  What to rec?

## 2023-03-31 NOTE — Telephone Encounter (Signed)
Spoke with patient and informed her.  Order placed and lab appt scheduled for 4:00pm.

## 2023-04-01 ENCOUNTER — Other Ambulatory Visit: Payer: Self-pay | Admitting: Nurse Practitioner

## 2023-04-01 ENCOUNTER — Telehealth: Payer: Self-pay

## 2023-04-01 ENCOUNTER — Other Ambulatory Visit: Payer: Self-pay

## 2023-04-01 DIAGNOSIS — N390 Urinary tract infection, site not specified: Secondary | ICD-10-CM

## 2023-04-01 NOTE — Telephone Encounter (Signed)
Tiffany, Did you send urgent referral info to [facility]?  She handles referrals.  [facility] does not see our Epic orders. Just checking.

## 2023-04-01 NOTE — Telephone Encounter (Signed)
Oh gotcha. Thank you for taking care of it.

## 2023-04-01 NOTE — Telephone Encounter (Signed)
Thank you for clarifying. Will wait on culture to treat. Please let patient know I have sent an urgent referral to urology.

## 2023-04-01 NOTE — Telephone Encounter (Signed)
Patient called back in voice mail to let TW know she is not taking AZO. Not taking any OTC meds at all.

## 2023-04-01 NOTE — Telephone Encounter (Signed)
I will place another order and route to [facility] on a separate encounter.  It is entered in Epic as external order and you do pick [facility]. I will take care of that and route to [facility]. We actually have to fax over records to them.

## 2023-04-01 NOTE — Telephone Encounter (Signed)
Spoke with patient and informed her. °

## 2023-04-01 NOTE — Telephone Encounter (Signed)
Referral faxed to [facility] °

## 2023-04-01 NOTE — Telephone Encounter (Signed)
I placed the order in EPIC. Should I also send Marena Chancy a message? I did not clarify [facility] on the referral.

## 2023-04-01 NOTE — Telephone Encounter (Signed)
Natalie Beady, NP requests urgent referral with [facility]. Any provider is fine. Diagnosis is recurrent UTIs.  Referral placed in EPIC.  Routed to [place] to fax orders and arrange referral.

## 2023-04-01 NOTE — Telephone Encounter (Signed)
-----   Message from [name], NP sent at 04/01/2023 10:06 AM EDT ----- Please ask patient if she has been taking OTC AZO or something similar. Her urine only has trace white blood cells but was nitrite positive (which can be a false positive if taking AZO). I do recommend waiting on culture.

## 2023-04-02 LAB — URINALYSIS, COMPLETE W/RFL CULTURE
Bilirubin Urine: NEGATIVE
Glucose, UA: NEGATIVE
Hyaline Cast: NONE SEEN /LPF
Nitrites, Initial: POSITIVE — AB
Protein, ur: NEGATIVE
Specific Gravity, Urine: 1.015 (ref 1.001–1.035)
pH: 7 (ref 5.0–8.0)

## 2023-04-02 LAB — URINE CULTURE
MICRO NUMBER:: 14824935
SPECIMEN QUALITY:: ADEQUATE

## 2023-04-02 LAB — CULTURE INDICATED

## 2023-06-02 ENCOUNTER — Other Ambulatory Visit (HOSPITAL_COMMUNITY)
Admission: RE | Admit: 2023-06-02 | Discharge: 2023-06-02 | Disposition: A | Discharge status: Home / Self Care | Payer: BC Managed Care – PPO | Source: Ambulatory Visit | Attending: Nurse Practitioner | Admitting: Nurse Practitioner

## 2023-06-02 ENCOUNTER — Ambulatory Visit (INDEPENDENT_AMBULATORY_CARE_PROVIDER_SITE_OTHER): Payer: BC Managed Care – PPO | Admitting: Nurse Practitioner

## 2023-06-02 ENCOUNTER — Encounter: Payer: Self-pay | Admitting: Nurse Practitioner

## 2023-06-02 VITALS — BP 110/64 | HR 78 | Ht 67.0 in | Wt 144.0 lb

## 2023-06-02 DIAGNOSIS — Z01419 Encounter for gynecological examination (general) (routine) without abnormal findings: Secondary | ICD-10-CM

## 2023-06-02 DIAGNOSIS — N951 Menopausal and female climacteric states: Secondary | ICD-10-CM | POA: Diagnosis not present

## 2023-06-02 DIAGNOSIS — Z124 Encounter for screening for malignant neoplasm of cervix: Secondary | ICD-10-CM

## 2023-06-02 DIAGNOSIS — Z30431 Encounter for routine checking of intrauterine contraceptive device: Secondary | ICD-10-CM

## 2023-06-02 NOTE — Progress Notes (Signed)
[name] December 12, 1976 [id number]   History:  47 y.o. G2P0002 presents for annual exam . Mirena IUD 02/2020, occasional light spotting. Thinks she is starting to have mild hot flashes and night sweats. Normal pap and mammogram history. Seeing urology for recurrent UTIs, on daily trimethoprim, plans to do pelvic floor PT.   Gynecologic History No LMP recorded. (Menstrual status: IUD).   Contraception/Family planning: IUD Sexually active: Yes  Health Maintenance Last Pap: 01/12/2018. Results were: Normal neg HPV, 5-year repeat Last mammogram: 12/02/2022. Results were: Right benign calcifications, 5-month follow up scheduled 06/04/23 Last colonoscopy: 2023. Results were: Normal per patient, 10-year recall Last Dexa: Not indicated  Past medical history, past surgical history, family history and social history were all reviewed and documented in the EPIC chart. 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 Mother and MGM with osteoporosis.   ROS:  A ROS was performed and pertinent positives and negatives are included.  Exam:  Vitals:   06/02/23 0903  BP: 110/64  Pulse: 78  SpO2: 100%  Weight: 144 lb (65.3 kg)  Height: 5\' 7"  (1.702 m)     Body mass index is 22.55 kg/m.  General appearance:  Normal Thyroid:  Symmetrical, normal in size, without palpable masses or nodularity. Respiratory  Auscultation:  Clear without wheezing or rhonchi Cardiovascular  Auscultation:  Regular rate, without rubs, murmurs or gallops  Edema/varicosities:  Not grossly evident Abdominal  Soft,nontender, without masses, guarding or rebound.  Liver/spleen:  No organomegaly noted  Hernia:  None appreciated  Skin  Inspection:  Grossly normal   Breasts: Examined lying and sitting.   Right: Without masses, retractions, discharge or axillary adenopathy.   Left: Without masses, retractions, discharge or axillary adenopathy. Genitourinary   Inguinal/mons:  Normal without  inguinal adenopathy  External genitalia:  Normal appearing vulva with no masses, tenderness, or lesions  BUS/Urethra/Skene's glands:  Normal  Vagina:  Normal appearing with normal color and discharge, no lesions  Cervix:  Normal appearing without discharge or lesions. IUD string visible  Uterus:  Normal in size, shape and contour.  Midline and mobile, nontender  Adnexa/parametria:     Rt: Normal in size, without masses or tenderness.   Lt: Normal in size, without masses or tenderness.  Anus and perineum: Normal  Digital rectal exam: Deferred  Patient informed chaperone available to be present for breast and pelvic exam. Patient has requested no chaperone to be present. Patient has been advised what will be completed during breast and pelvic exam.   Assessment/Plan:  47 y.o. G2P0002 for annual exam.   Well female exam with routine gynecological exam - Plan: CBC with Differential/Platelet, Comprehensive metabolic panel. Education provided on SBEs, importance of preventative screenings, current guidelines, high calcium diet, regular exercise, and multivitamin daily.  Normal lipid panel last year, not fasting today.   Screening for cervical cancer - Plan: Cytology - PAP( [place]). Normal pap history.   Perimenopausal vasomotor symptoms - Discussed what to expect with perimenopause. Recommend OTC supplements if needed.   Encounter for routine checking of intrauterine contraceptive device (IUD) - Mirena IUD 02/2020, occasional light spotting. Strings visible during exam. She is aware of 8-year FDA approval.   Screening for breast cancer - Being followed for benign calcifications in right breast. Scheduled this week. Normal breast exam today.  Screening for colon cancer - Colonoscopy 2023. Will repeat at GI's recommended interval.   Screening for osteoporosis - Mother and grandmother with osteoporosis. Will plan DXA at  menopause. Recommend multivitamin and regular exercise.   Return in 1  year for annual.       [name] DNP, 9:14 AM 06/02/2023

## 2023-06-03 LAB — CBC WITH DIFFERENTIAL/PLATELET
Absolute Monocytes: 397 cells/uL (ref 200–950)
Basophils Absolute: 49 cells/uL (ref 0–200)
Basophils Relative: 1 %
Eosinophils Absolute: 98 cells/uL (ref 15–500)
Eosinophils Relative: 2 %
HCT: 38 % (ref 35.0–45.0)
Hemoglobin: 12.4 g/dL (ref 11.7–15.5)
Lymphs Abs: 1392 cells/uL (ref 850–3900)
MCH: 31.1 pg (ref 27.0–33.0)
MCHC: 32.6 g/dL (ref 32.0–36.0)
MCV: 95.2 fL (ref 80.0–100.0)
MPV: 9.4 fL (ref 7.5–12.5)
Monocytes Relative: 8.1 %
Neutro Abs: 2965 cells/uL (ref 1500–7800)
Neutrophils Relative %: 60.5 %
Platelets: 267 10*3/uL (ref 140–400)
RBC: 3.99 10*6/uL (ref 3.80–5.10)
RDW: 11.8 % (ref 11.0–15.0)
Total Lymphocyte: 28.4 %
WBC: 4.9 10*3/uL (ref 3.8–10.8)

## 2023-06-03 LAB — COMPREHENSIVE METABOLIC PANEL
AG Ratio: 1.8 (calc) (ref 1.0–2.5)
ALT: 11 U/L (ref 6–29)
AST: 16 U/L (ref 10–35)
Albumin: 4.6 g/dL (ref 3.6–5.1)
Alkaline phosphatase (APISO): 40 U/L (ref 31–125)
BUN/Creatinine Ratio: 17 (calc) (ref 6–22)
BUN: 17 mg/dL (ref 7–25)
CO2: 23 mmol/L (ref 20–32)
Calcium: 9.6 mg/dL (ref 8.6–10.2)
Chloride: 104 mmol/L (ref 98–110)
Creat: 1.01 mg/dL — ABNORMAL HIGH (ref 0.50–0.99)
Globulin: 2.5 g/dL (calc) (ref 1.9–3.7)
Glucose, Bld: 74 mg/dL (ref 65–99)
Potassium: 4.1 mmol/L (ref 3.5–5.3)
Sodium: 138 mmol/L (ref 135–146)
Total Bilirubin: 0.7 mg/dL (ref 0.2–1.2)
Total Protein: 7.1 g/dL (ref 6.1–8.1)

## 2023-06-03 LAB — CYTOLOGY - PAP
Comment: NEGATIVE
Diagnosis: NEGATIVE
High risk HPV: NEGATIVE

## 2023-06-04 ENCOUNTER — Ambulatory Visit
Admission: RE | Admit: 2023-06-04 | Discharge: 2023-06-04 | Disposition: A | Discharge status: Home / Self Care | Payer: BC Managed Care – PPO | Source: Ambulatory Visit | Attending: Nurse Practitioner | Admitting: Nurse Practitioner

## 2023-06-04 ENCOUNTER — Other Ambulatory Visit: Payer: Self-pay | Admitting: Nurse Practitioner

## 2023-06-04 DIAGNOSIS — R928 Other abnormal and inconclusive findings on diagnostic imaging of breast: Secondary | ICD-10-CM

## 2023-06-07 IMAGING — MG MM DIGITAL SCREENING BILAT W/ TOMO AND CAD
8 series · 9 of 24 positions shown · non-contrast
Comparison: Previous exam(s).

CLINICAL DATA: Screening.

EXAM:
DIGITAL SCREENING BILATERAL MAMMOGRAM WITH TOMOSYNTHESIS AND CAD
TECHNIQUE: Bilateral screening digital craniocaudal and mediolateral oblique
mammograms were obtained. Bilateral screening digital breast
tomosynthesis was performed. The images were evaluated with
computer-aided detection.

[R MLO synth-2D]
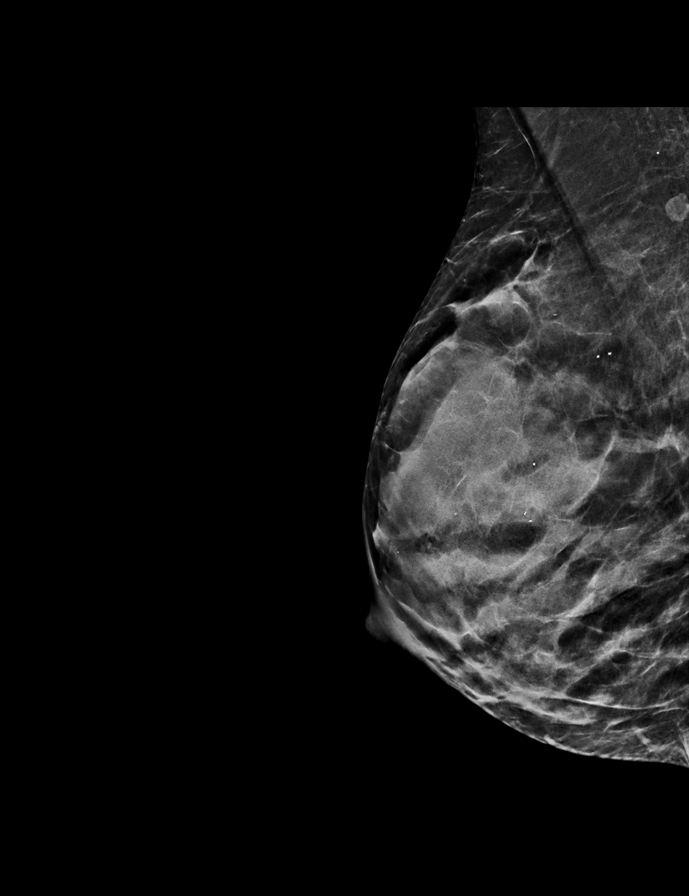

[R CC synth-2D]
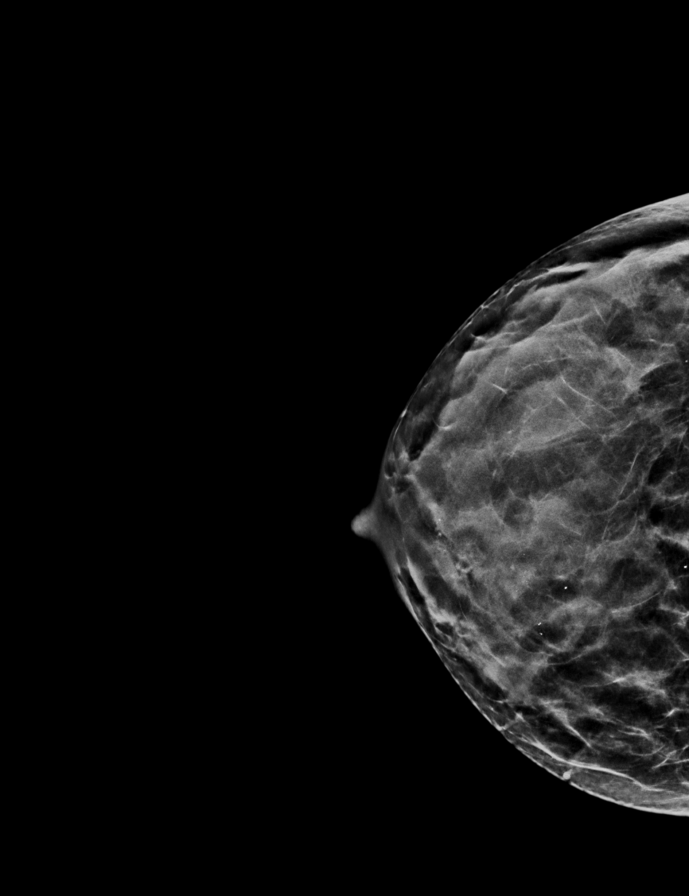

[L CC synth-2D]
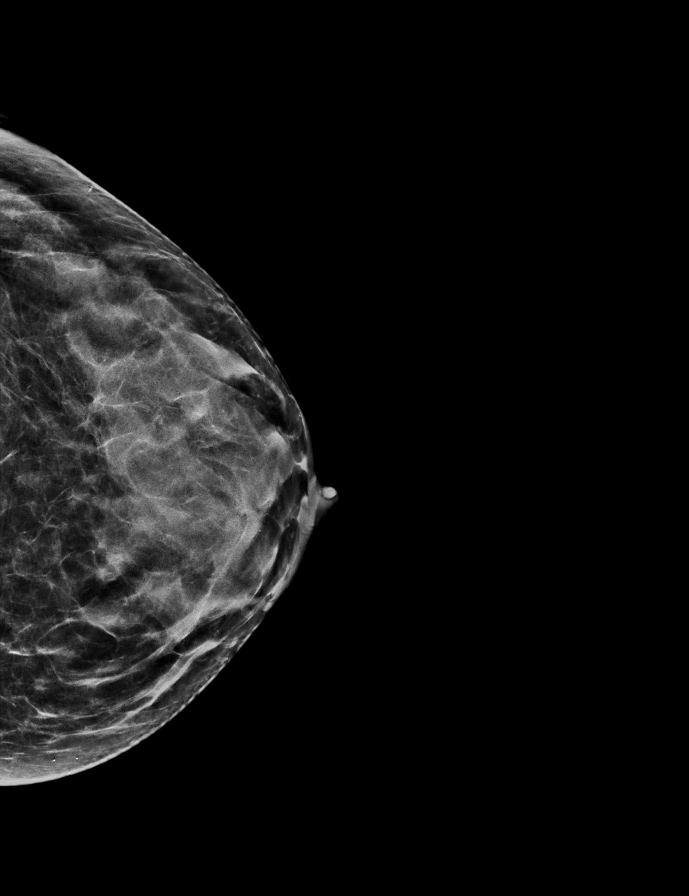

[L MLO synth-2D]
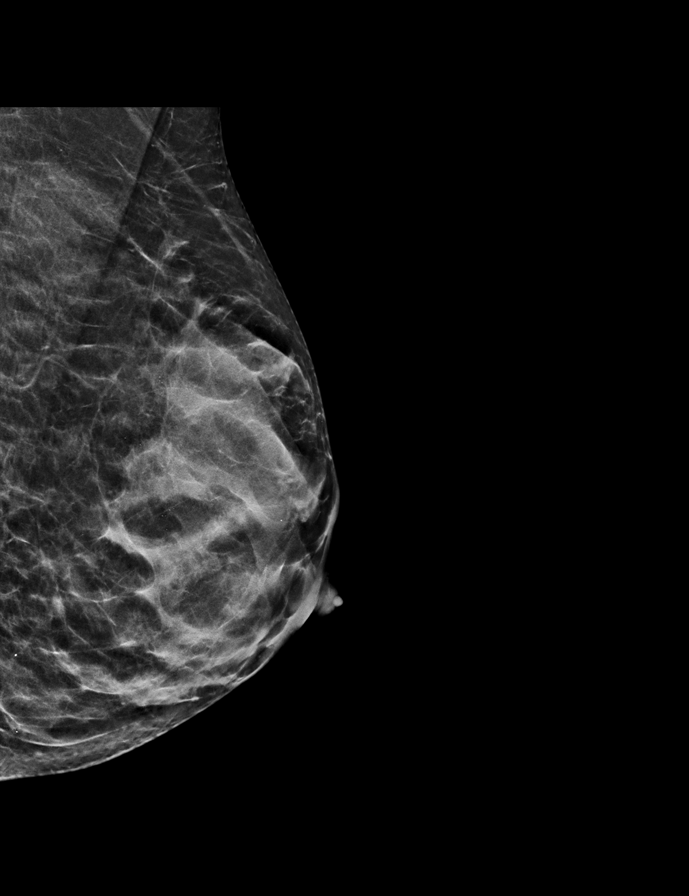

[R MLO tomo · 2 of 49 frames shown]
[frame 16/49]
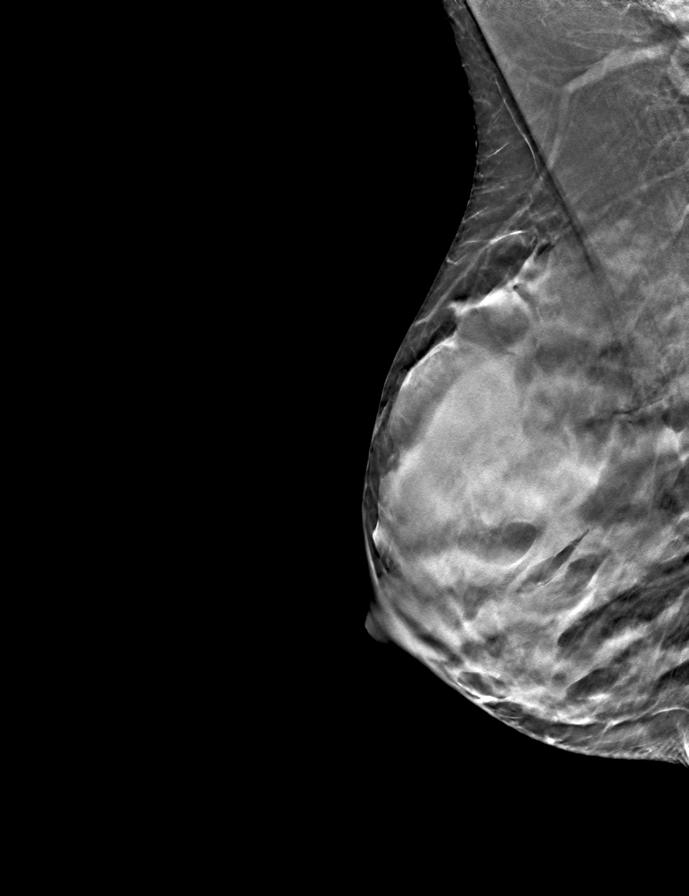
[frame 25/49]
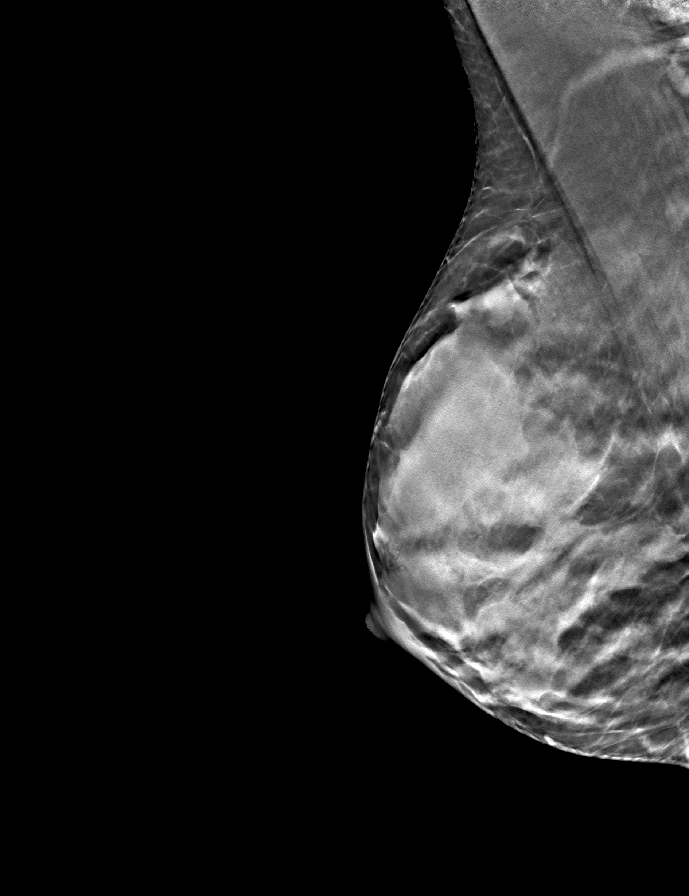

[R CC tomo · tomo slice 23/46.0]
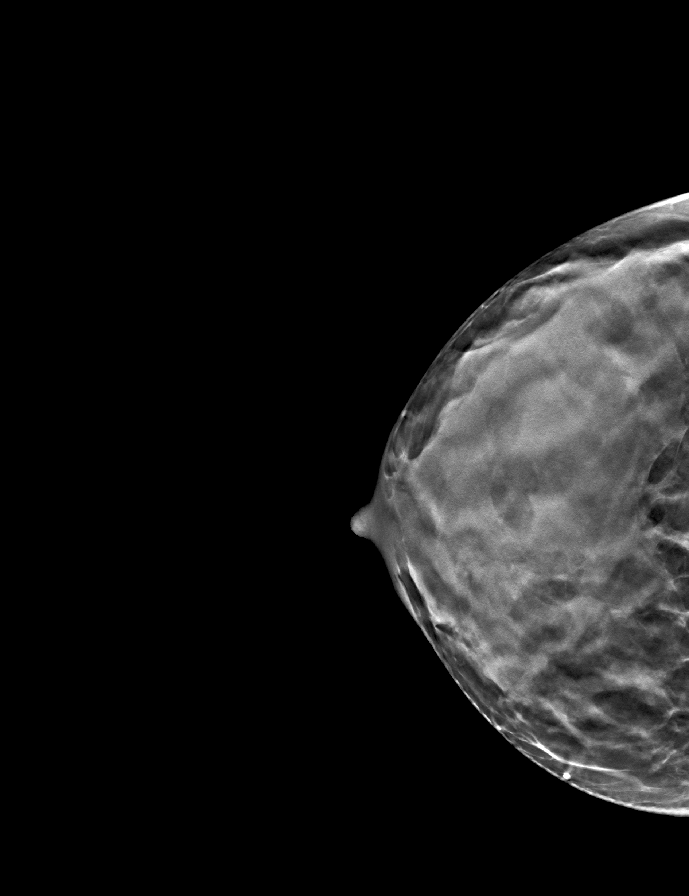

[L CC tomo · tomo slice 26/51.0]
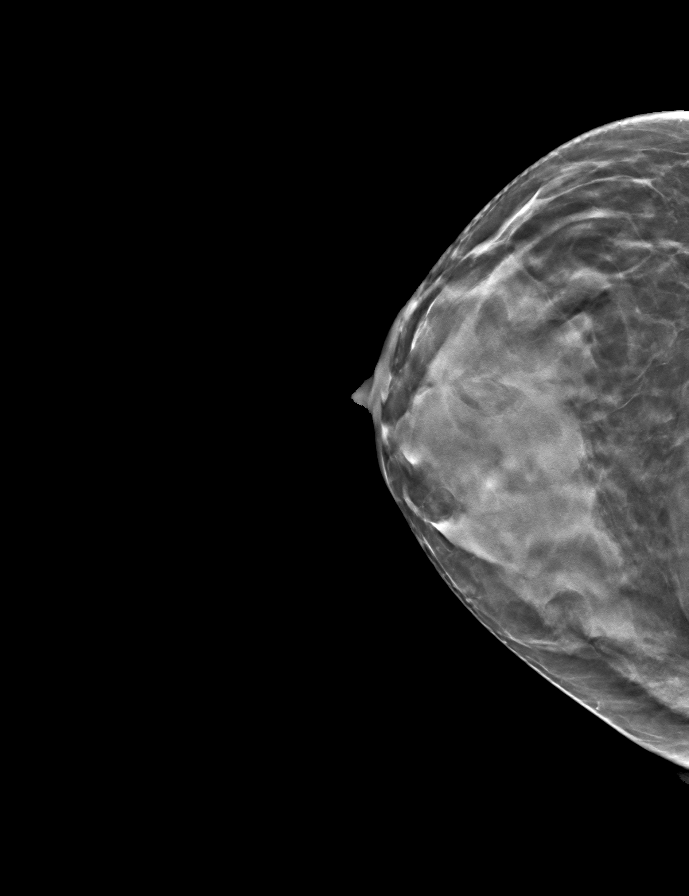

[L MLO tomo · tomo slice 25/48.0]
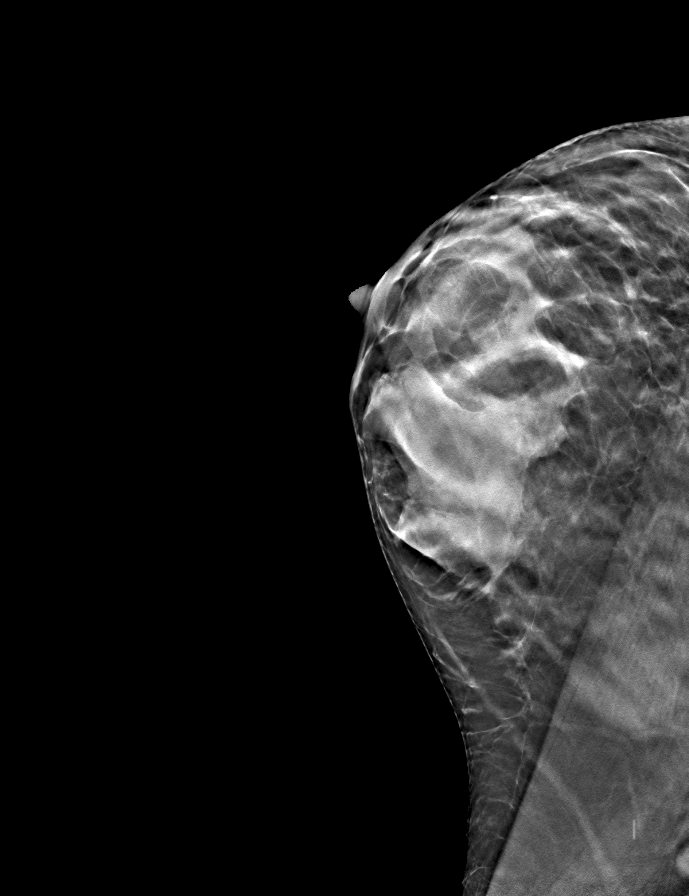

[9 of 24 positions shown; findings below may reference images not displayed]

ACR Breast Density Category d: The breast tissue is extremely dense,
which lowers the sensitivity of mammography
FINDINGS: There are no findings suspicious for malignancy.
IMPRESSION: No mammographic evidence of malignancy. A result letter of this
screening mammogram will be mailed directly to the patient.

RECOMMENDATION:
Screening mammogram in one year. (Code:TA-V-WV9)

BI-RADS CATEGORY  1: Negative.

## 2023-10-06 ENCOUNTER — Ambulatory Visit
Admission: RE | Admit: 2023-10-06 | Discharge: 2023-10-06 | Disposition: A | Discharge status: Home / Self Care | Payer: BC Managed Care – PPO | Source: Ambulatory Visit | Attending: Nurse Practitioner | Admitting: Nurse Practitioner

## 2023-10-06 DIAGNOSIS — R928 Other abnormal and inconclusive findings on diagnostic imaging of breast: Secondary | ICD-10-CM

## 2023-10-07 ENCOUNTER — Other Ambulatory Visit: Payer: Self-pay | Admitting: Nurse Practitioner

## 2023-10-07 DIAGNOSIS — R921 Mammographic calcification found on diagnostic imaging of breast: Secondary | ICD-10-CM

## 2023-10-08 ENCOUNTER — Encounter: Payer: Self-pay | Admitting: Nurse Practitioner

## 2023-12-23 ENCOUNTER — Ambulatory Visit (INDEPENDENT_AMBULATORY_CARE_PROVIDER_SITE_OTHER): Payer: 59 | Admitting: Nurse Practitioner

## 2023-12-23 VITALS — BP 122/74 | HR 78 | Temp 97.8°F | Wt 149.0 lb

## 2023-12-23 DIAGNOSIS — N3001 Acute cystitis with hematuria: Secondary | ICD-10-CM | POA: Diagnosis not present

## 2023-12-23 DIAGNOSIS — R300 Dysuria: Secondary | ICD-10-CM

## 2023-12-23 MED ORDER — NITROFURANTOIN MONOHYD MACRO 100 MG PO CAPS
100.0000 mg | ORAL_CAPSULE | Freq: Two times a day (BID) | ORAL | 0 refills | Status: DC
Start: 2023-12-23 — End: 2024-01-26

## 2023-12-23 NOTE — Progress Notes (Signed)
   Acute Office Visit  Subjective:    Patient ID: [name], female    DOB: 07/19/1976, 48 y.o.   MRN: [id number]   HPI 48 y.o. presents today for dysuria, frequency, urgency and right flank pain since yesterday.   No LMP recorded. (Menstrual status: IUD).    Review of Systems  Constitutional: Negative.  Negative for chills and fever.  Genitourinary:  Positive for dysuria, flank pain, frequency and urgency. Negative for difficulty urinating and hematuria.       Objective:    Physical Exam Constitutional:      Appearance: Normal appearance.  Abdominal:     Tenderness: There is no right CVA tenderness or left CVA tenderness.   GU: Not indicated  BP 122/74   Pulse 78   Temp 97.8 F (36.6 C)   Wt 149 lb (67.6 kg)   SpO2 100%   BMI 23.34 kg/m  Wt Readings from Last 3 Encounters:  12/23/23 149 lb (67.6 kg)  06/02/23 144 lb (65.3 kg)  03/20/22 140 lb (63.5 kg)        UA: 2+ leukocytes, pos nitrites, 2+ blood, 2+ protein, dark yellow/cloudy. Microscopic: wbc 40-60, rbc 10-20, many bacteria  Assessment & Plan:   Problem List Items Addressed This Visit   None Visit Diagnoses       Acute cystitis with hematuria    -  Primary   Relevant Medications   nitrofurantoin , macrocrystal-monohydrate, (MACROBID ) 100 MG capsule     Dysuria       Relevant Orders   Urinalysis,Complete w/RFL Culture      Plan: Acute cystitis - Nitrofurantoin  100 mg BID x 7 days. Culture pending. Declines pyridium.   Return if symptoms worsen or fail to improve.    [name] DNP, 1:55 PM 12/23/2023

## 2023-12-24 LAB — URINALYSIS, COMPLETE W/RFL CULTURE
Bilirubin Urine: NEGATIVE
Glucose, UA: NEGATIVE
Hyaline Cast: NONE SEEN /[LPF]
Ketones, ur: NEGATIVE
Nitrites, Initial: POSITIVE — AB
Specific Gravity, Urine: 1.025 (ref 1.001–1.035)
pH: 6 (ref 5.0–8.0)

## 2023-12-24 LAB — URINE CULTURE
MICRO NUMBER:: 15927121
SPECIMEN QUALITY:: ADEQUATE

## 2023-12-24 LAB — CULTURE INDICATED

## 2024-01-01 ENCOUNTER — Telehealth: Payer: Self-pay

## 2024-01-01 NOTE — Telephone Encounter (Signed)
Pt LVM in triage line stating that she recently seen TW for UTI & has completed the macrobid that was prescribed. However, states that she seems to still be experiencing some sxs indicating that the UTI may not completely be gone.   LVMTCB. Will try again shortly before closing.   Spoke w/ the pt and she reports that her sxs were resolved by the time she completed the medication yesterday, but then today, they came back the same, if not slightly worse with the increased pelvic pressure, urinary urgency, & dysuria towards the end of the stream.   Advised pt per TW's mychart msg sent w/ ucx results that the result actually did not indicate a UTI and asked her the questions:  Pt reported at the time of call that she has not tried/taken any OTC bladder sx meds, also reported/confirmed no vaginal/vulvar sxs, then finally reports no known hx of kidney stones.  Please advise.

## 2024-01-02 NOTE — Telephone Encounter (Signed)
Pt notified and voiced understanding.   Reports she didn't pick up the AZO last night because it seemed as if her symptoms had improved after our phone call but mentioned she did wake up in the middle of the night with some cloudiness to her urine and is experiencing some lower flank discomfort today. Denied signs/sxs of fever at the time of call.  Pt advised that I will update with the provider but if anything additional I will let her know. If not, then AZO/monitor over the weekend and call with an update on Monday.   Pt voiced understanding/appreciation for the cb/recommendations.  Encounter forwarded for final review.

## 2024-01-02 NOTE — Telephone Encounter (Signed)
AZO over the weekend, if no improvement by Monday OV

## 2024-01-19 ENCOUNTER — Telehealth: Payer: Self-pay | Admitting: *Deleted

## 2024-01-19 ENCOUNTER — Other Ambulatory Visit: Payer: Self-pay | Admitting: Nurse Practitioner

## 2024-01-19 ENCOUNTER — Other Ambulatory Visit: Payer: 59

## 2024-01-19 DIAGNOSIS — R300 Dysuria: Secondary | ICD-10-CM

## 2024-01-19 NOTE — Telephone Encounter (Signed)
Spoke with patient. Patient was scheduled by front office for lab appt this afternoon at 4pm.   Patient reports urinary symptom have not resolved since last OV 12/23/23. Reports cloudy urine, odor, urinary frequency.   Patient aware she will be called with results once completed.   Routing to provider for final review. Patient is agreeable to disposition. Will close encounter.

## 2024-01-20 ENCOUNTER — Other Ambulatory Visit: Payer: Self-pay | Admitting: Nurse Practitioner

## 2024-01-20 DIAGNOSIS — N3000 Acute cystitis without hematuria: Secondary | ICD-10-CM

## 2024-01-20 MED ORDER — SULFAMETHOXAZOLE-TRIMETHOPRIM 800-160 MG PO TABS
1.0000 | ORAL_TABLET | Freq: Two times a day (BID) | ORAL | 0 refills | Status: AC
Start: 2024-01-20 — End: 2024-01-23

## 2024-01-22 ENCOUNTER — Encounter: Payer: Self-pay | Admitting: Nurse Practitioner

## 2024-01-22 DIAGNOSIS — N390 Urinary tract infection, site not specified: Secondary | ICD-10-CM

## 2024-01-22 LAB — URINALYSIS, COMPLETE W/RFL CULTURE
Bilirubin Urine: NEGATIVE
Glucose, UA: NEGATIVE
Hyaline Cast: NONE SEEN /LPF
Nitrites, Initial: POSITIVE — AB
Specific Gravity, Urine: 1.029 (ref 1.001–1.035)
WBC, UA: >=60 /[HPF] — AB (ref 0–5)
pH: 7 (ref 5.0–8.0)

## 2024-01-22 LAB — URINE CULTURE
MICRO NUMBER:: 16035670
SPECIMEN QUALITY:: ADEQUATE

## 2024-01-22 LAB — CULTURE INDICATED

## 2024-01-22 NOTE — Telephone Encounter (Signed)
 For final review/additional recommendations.  Encounter closed.

## 2024-01-26 MED ORDER — NITROFURANTOIN MONOHYD MACRO 100 MG PO CAPS
100.0000 mg | ORAL_CAPSULE | Freq: Two times a day (BID) | ORAL | 0 refills | Status: DC
Start: 2024-01-26 — End: 2024-06-14

## 2024-01-26 NOTE — Addendum Note (Signed)
 Addended by: Romaine Closs on: 01/26/2024 05:38 PM   Modules accepted: Orders

## 2024-01-26 NOTE — Telephone Encounter (Signed)
 Pt also LVM in triage line regarding this topic.

## 2024-04-06 ENCOUNTER — Ambulatory Visit
Admission: RE | Admit: 2024-04-06 | Discharge: 2024-04-06 | Disposition: A | Discharge status: Home / Self Care | Payer: BC Managed Care – PPO | Source: Ambulatory Visit | Attending: Nurse Practitioner | Admitting: Nurse Practitioner

## 2024-04-06 DIAGNOSIS — R921 Mammographic calcification found on diagnostic imaging of breast: Secondary | ICD-10-CM

## 2024-04-07 ENCOUNTER — Other Ambulatory Visit: Payer: Self-pay | Admitting: Nurse Practitioner

## 2024-04-07 DIAGNOSIS — R921 Mammographic calcification found on diagnostic imaging of breast: Secondary | ICD-10-CM

## 2024-06-08 ENCOUNTER — Ambulatory Visit: Payer: BC Managed Care – PPO | Admitting: Nurse Practitioner

## 2024-06-14 ENCOUNTER — Encounter: Payer: Self-pay | Admitting: Nurse Practitioner

## 2024-06-14 ENCOUNTER — Ambulatory Visit (INDEPENDENT_AMBULATORY_CARE_PROVIDER_SITE_OTHER): Payer: BC Managed Care – PPO | Admitting: Nurse Practitioner

## 2024-06-14 VITALS — BP 116/74 | HR 73 | Ht 66.75 in | Wt 145.0 lb

## 2024-06-14 DIAGNOSIS — Z01419 Encounter for gynecological examination (general) (routine) without abnormal findings: Secondary | ICD-10-CM

## 2024-06-14 DIAGNOSIS — N951 Menopausal and female climacteric states: Secondary | ICD-10-CM

## 2024-06-14 DIAGNOSIS — Z30431 Encounter for routine checking of intrauterine contraceptive device: Secondary | ICD-10-CM | POA: Diagnosis not present

## 2024-06-14 DIAGNOSIS — Z1331 Encounter for screening for depression: Secondary | ICD-10-CM

## 2024-06-14 NOTE — Progress Notes (Signed)
 [name] 09-16-1976 [id number]   History:  48 y.o. G2P0002 presents for annual exam . Mirena  IUD 02/2020, occasional light spotting. Thinks she is starting to have mild hot flashes and night sweats, joint pain. Normal pap and mammogram history. Sees urology for recurrent UTIs.  Gynecologic History No LMP recorded. (Menstrual status: IUD).   Contraception/Family planning: IUD Sexually active: Yes  Health Maintenance Last Pap: 06/02/2023. Results were: Normal neg HPV Last mammogram: 04/06/2024. Results were: Right stable, benign calcifications, 98-month follow up recommended Last colonoscopy: 2023. Results were: Normal per patient, 10-year recall Last Dexa: Not indicated     06/14/2024   10:15 AM  Depression screen PHQ 2/9  Decreased Interest 0  PHQ - 2 Score 0     Past medical history, past surgical history, family history and social history were all reviewed and documented in the EPIC chart. 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  Youngest will be junior, plans for nursing. Mother and MGM with osteoporosis.   ROS:  A ROS was performed and pertinent positives and negatives are included.  Exam:  Vitals:   06/14/24 1012  BP: 116/74  Pulse: 73  SpO2: 98%  Weight: 145 lb (65.8 kg)  Height: 5' 6.75 (1.695 m)     Body mass index is 22.88 kg/m.  General appearance:  Normal Thyroid:  Symmetrical, normal in size, without palpable masses or nodularity. Respiratory  Auscultation:  Clear without wheezing or rhonchi Cardiovascular  Auscultation:  Regular rate, without rubs, murmurs or gallops  Edema/varicosities:  Not grossly evident Abdominal  Soft,nontender, without masses, guarding or rebound.  Liver/spleen:  No organomegaly noted  Hernia:  None appreciated  Skin  Inspection:  Grossly normal   Breasts: Examined lying and sitting.   Right: Without masses, retractions, discharge or axillary  adenopathy.   Left: Without masses, retractions, discharge or axillary adenopathy. Pelvic: External genitalia:  no lesions              Urethra:  normal appearing urethra with no masses, tenderness or lesions              Bartholins and Skenes: normal                 Vagina: normal appearing vagina with normal color and discharge, no lesions              Cervix: no lesions. + IUD strings Bimanual Exam:  Uterus:  no masses or tenderness              Adnexa: no mass, fullness, tenderness              Rectovaginal: Deferred              Anus:  normal, no lesions  Natalie Mole, NP student performed exam with observation.   Assessment/Plan:  48 y.o. G2P0002 for annual exam.   Well female exam with routine gynecological exam - Plan: CBC with Differential/Platelet, Comprehensive metabolic panel. Education provided on SBEs, importance of preventative screenings, current guidelines, high calcium diet, regular exercise, and multivitamin daily.   Perimenopausal - Discussed what to expect with perimenopause. Recommend OTC supplements if needed.   Encounter for routine checking of intrauterine contraceptive device (IUD) - Mirena  IUD 02/2020, occasional light spotting. Strings visible during exam. She is aware of 8-year FDA approval.   Screening for cervical cancer - Normal pap history. Will repeat at 5-year interval per guidelines.   Screening  for breast cancer - Being followed for benign calcifications in right breast. Normal breast exam today.  Screening for colon cancer - Colonoscopy 2023. Will repeat at GI's recommended interval.   Screening for osteoporosis - Mother and grandmother with osteoporosis. Will plan DXA at menopause. Recommend multivitamin and regular exercise.   Return in about 1 year (around 06/14/2025) for Annual.      Natalie DELENA Shutter DNP, 10:46 AM 06/14/2024

## 2024-06-15 ENCOUNTER — Ambulatory Visit: Payer: Self-pay | Admitting: Nurse Practitioner

## 2024-06-15 LAB — CBC WITH DIFFERENTIAL/PLATELET
Absolute Lymphocytes: 1401 {cells}/uL (ref 850–3900)
Absolute Monocytes: 522 {cells}/uL (ref 200–950)
Basophils Absolute: 52 {cells}/uL (ref 0–200)
Basophils Relative: 0.6 %
Eosinophils Absolute: 148 {cells}/uL (ref 15–500)
Eosinophils Relative: 1.7 %
HCT: 42.3 % (ref 35.0–45.0)
Hemoglobin: 13.4 g/dL (ref 11.7–15.5)
MCH: 30.6 pg (ref 27.0–33.0)
MCHC: 31.7 g/dL — ABNORMAL LOW (ref 32.0–36.0)
MCV: 96.6 fL (ref 80.0–100.0)
MPV: 9.4 fL (ref 7.5–12.5)
Monocytes Relative: 6 %
Neutro Abs: 6577 {cells}/uL (ref 1500–7800)
Neutrophils Relative %: 75.6 %
Platelets: 297 10*3/uL (ref 140–400)
RBC: 4.38 10*6/uL (ref 3.80–5.10)
RDW: 11.8 % (ref 11.0–15.0)
Total Lymphocyte: 16.1 %
WBC: 8.7 10*3/uL (ref 3.8–10.8)

## 2024-06-15 LAB — COMPREHENSIVE METABOLIC PANEL WITH GFR
AG Ratio: 1.6 (calc) (ref 1.0–2.5)
ALT: 10 U/L (ref 6–29)
AST: 13 U/L (ref 10–35)
Albumin: 4.5 g/dL (ref 3.6–5.1)
Alkaline phosphatase (APISO): 42 U/L (ref 31–125)
BUN: 22 mg/dL (ref 7–25)
CO2: 25 mmol/L (ref 20–32)
Calcium: 9.7 mg/dL (ref 8.6–10.2)
Chloride: 104 mmol/L (ref 98–110)
Creat: 0.93 mg/dL (ref 0.50–0.99)
Globulin: 2.9 g/dL (ref 1.9–3.7)
Glucose, Bld: 67 mg/dL (ref 65–99)
Potassium: 4 mmol/L (ref 3.5–5.3)
Sodium: 139 mmol/L (ref 135–146)
Total Bilirubin: 0.7 mg/dL (ref 0.2–1.2)
Total Protein: 7.4 g/dL (ref 6.1–8.1)
eGFR: 75 mL/min/{1.73_m2} (ref >=60)

## 2024-08-31 NOTE — Progress Notes (Signed)
   48 y.o. [id number] female with Mirena  here for UTI sx. Married.  Patient's last menstrual period was 08/24/2024 (approximate).   She reports lower back pain, frequent urination, incomplete emptying. Took AZO yesterday, a little relief. Lot of discomfort today. No fever, N/V. Symptoms have been present for 2 days.  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     tamsulosin (FLOMAX) 0.4 MG CAPS capsule Take 0.8 mg by mouth at bedtime. (Patient not taking: Reported on 09/01/2024)     Current Facility-Administered Medications on File Prior to Visit  Medication Dose Route Frequency Provider Last Rate Last Admin   levonorgestrel  (MIRENA ) 20 MCG/24HR IUD   Intrauterine Once Fontaine, Evalene SQUIBB, MD       Allergies  Allergen Reactions   Cephalexin Rash      PE Today's Vitals   09/01/24 1448  BP: 100/62  Pulse: 75  Temp: 98.1 F (36.7 C)  TempSrc: Oral  SpO2: 98%  Weight: 146 lb (66.2 kg)   Body mass index is 23.04 kg/m.  Physical Exam Vitals reviewed.  Constitutional:      General: She is not in acute distress.    Appearance: Normal appearance.  HENT:     Head: Normocephalic and atraumatic.     Nose: Nose normal.  Eyes:     Extraocular Movements: Extraocular movements  intact.     Conjunctiva/sclera: Conjunctivae normal.  Pulmonary:     Effort: Pulmonary effort is normal.  Musculoskeletal:        General: Normal range of motion.     Cervical back: Normal range of motion.  Neurological:     General: No focal deficit present.     Mental Status: She is alert.  Psychiatric:        Mood and Affect: Mood normal.        Behavior: Behavior normal.      Assessment and Plan:        Urinary frequency -     Urinalysis,Complete w/RFL Culture  Nitrite neg, will f/u Ucx Continue PO hydration and AZO Discussed PV estrogen recurrent UTI, declines at this time   Vera LULLA Pa, MD

## 2024-09-01 ENCOUNTER — Encounter: Payer: Self-pay | Admitting: Obstetrics and Gynecology

## 2024-09-01 ENCOUNTER — Ambulatory Visit: Admitting: Obstetrics and Gynecology

## 2024-09-01 VITALS — BP 100/62 | HR 75 | Temp 98.1°F | Wt 146.0 lb

## 2024-09-01 DIAGNOSIS — R350 Frequency of micturition: Secondary | ICD-10-CM | POA: Diagnosis not present

## 2024-09-03 ENCOUNTER — Ambulatory Visit (HOSPITAL_BASED_OUTPATIENT_CLINIC_OR_DEPARTMENT_OTHER): Payer: Self-pay | Admitting: Obstetrics and Gynecology

## 2024-09-03 LAB — URINE CULTURE
MICRO NUMBER:: 16979987
Result:: NO GROWTH
SPECIMEN QUALITY:: ADEQUATE

## 2024-09-03 LAB — URINALYSIS, COMPLETE W/RFL CULTURE
Bilirubin Urine: NEGATIVE
Glucose, UA: NEGATIVE
Hyaline Cast: NONE SEEN /LPF
Ketones, ur: NEGATIVE
Nitrites, Initial: NEGATIVE
Protein, ur: NEGATIVE
Specific Gravity, Urine: 1.02 (ref 1.001–1.035)
pH: 6 (ref 5.0–8.0)

## 2024-09-03 LAB — CULTURE INDICATED

## 2024-10-08 ENCOUNTER — Ambulatory Visit
Admission: RE | Admit: 2024-10-08 | Discharge: 2024-10-08 | Disposition: A | Discharge status: Home / Self Care | Source: Ambulatory Visit | Attending: Nurse Practitioner | Admitting: Nurse Practitioner

## 2024-10-08 DIAGNOSIS — R921 Mammographic calcification found on diagnostic imaging of breast: Secondary | ICD-10-CM

## 2024-10-11 ENCOUNTER — Ambulatory Visit: Payer: Self-pay | Admitting: Nurse Practitioner

## 2024-10-22 ENCOUNTER — Encounter: Payer: Self-pay | Admitting: Nurse Practitioner

## 2024-10-22 ENCOUNTER — Ambulatory Visit: Admitting: Nurse Practitioner

## 2024-10-22 VITALS — BP 100/72 | HR 76 | Temp 98.1°F | Wt 150.0 lb

## 2024-10-22 DIAGNOSIS — N898 Other specified noninflammatory disorders of vagina: Secondary | ICD-10-CM

## 2024-10-22 DIAGNOSIS — N3001 Acute cystitis with hematuria: Secondary | ICD-10-CM | POA: Diagnosis not present

## 2024-10-22 DIAGNOSIS — B9689 Other specified bacterial agents as the cause of diseases classified elsewhere: Secondary | ICD-10-CM

## 2024-10-22 DIAGNOSIS — N760 Acute vaginitis: Secondary | ICD-10-CM

## 2024-10-22 DIAGNOSIS — R350 Frequency of micturition: Secondary | ICD-10-CM

## 2024-10-22 LAB — WET PREP FOR TRICH, YEAST, CLUE

## 2024-10-22 MED ORDER — NITROFURANTOIN MONOHYD MACRO 100 MG PO CAPS
100.0000 mg | ORAL_CAPSULE | Freq: Two times a day (BID) | ORAL | 0 refills | Status: DC
Start: 2024-10-22 — End: 2024-10-30

## 2024-10-22 MED ORDER — METRONIDAZOLE 0.75 % VA GEL
1.0000 | Freq: Every day | VAGINAL | 0 refills | Status: AC
Start: 2024-10-22 — End: 2024-10-27

## 2024-10-22 NOTE — Progress Notes (Signed)
   Acute Office Visit  Subjective:    Patient ID: [name], female    DOB: July 18, 1976, 48 y.o.   MRN: [id number]   HPI 48 y.o. presents today for urinary frequency, feeling like she is not fully emptying bladder, and burning at end of urination x 4 days. Has noticed a vaginal odor as well. Denies itching or discharge.   Patient's last menstrual period was 10/22/2024 (exact date).    Review of Systems  Constitutional:  Negative for chills and fever.  Genitourinary:  Positive for dysuria, frequency and urgency. Negative for difficulty urinating, flank pain, hematuria, vaginal discharge and vaginal pain.       Vaginal odor       Objective:    Physical Exam Constitutional:      Appearance: Normal appearance.  Genitourinary:    General: Normal vulva.     Vagina: Vaginal discharge present.     BP 100/72 (BP Location: Right Arm, Patient Position: Sitting, Cuff Size: Normal)   Pulse 76   Temp 98.1 F (36.7 C) (Oral)   Wt 150 lb (68 kg)   LMP 10/22/2024 (Exact Date)   SpO2 99%   BMI 23.67 kg/m  Wt Readings from Last 3 Encounters:  10/22/24 150 lb (68 kg)  09/01/24 146 lb (66.2 kg)  06/14/24 145 lb (65.8 kg)        [name], CMA present as biomedical engineer.   UA: 1+ leukocytes, nitrite +, blood 2+, yellow/cloudy. Microscopic: wbc 40-60, rbc 10-20, many bacteria, + odor  Wet prep + clue cells (+ odor)  Assessment & Plan:   Problem List Items Addressed This Visit   None Visit Diagnoses       Acute cystitis with hematuria    -  Primary   Relevant Medications   nitrofurantoin , macrocrystal-monohydrate, (MACROBID ) 100 MG capsule     Frequent urination       Relevant Orders   Urinalysis,Complete w/RFL Culture     Vaginal odor       Relevant Orders   WET PREP FOR TRICH, YEAST, CLUE     Bacterial vaginosis       Relevant Medications      metroNIDAZOLE (METROGEL) 0.75 % vaginal gel      Plan: Acute cystitis - Nitrofurantoin  100 mg BID x 7 days. Culture  pending. Increase water intake, can take AZO as needed. BV - Metrogel nightly x 5 nights.   Return if symptoms worsen or fail to improve.    Natalie DELENA Shutter DNP, 9:35 AM 10/22/2024

## 2024-10-25 ENCOUNTER — Ambulatory Visit: Payer: Self-pay | Admitting: Nurse Practitioner

## 2024-10-25 LAB — URINALYSIS, COMPLETE W/RFL CULTURE
Bilirubin Urine: NEGATIVE
Glucose, UA: NEGATIVE
Hyaline Cast: NONE SEEN /LPF
Ketones, ur: NEGATIVE
Nitrites, Initial: POSITIVE — AB
Protein, ur: NEGATIVE
Specific Gravity, Urine: 1.015 (ref 1.001–1.035)
pH: 7.5 (ref 5.0–8.0)

## 2024-10-25 LAB — URINE CULTURE
MICRO NUMBER:: 17206156
SPECIMEN QUALITY:: ADEQUATE

## 2024-10-25 LAB — CULTURE INDICATED

## 2024-10-30 ENCOUNTER — Other Ambulatory Visit (HOSPITAL_BASED_OUTPATIENT_CLINIC_OR_DEPARTMENT_OTHER): Payer: Self-pay | Admitting: Obstetrics & Gynecology

## 2024-10-30 ENCOUNTER — Encounter (HOSPITAL_BASED_OUTPATIENT_CLINIC_OR_DEPARTMENT_OTHER): Payer: Self-pay | Admitting: Obstetrics & Gynecology

## 2024-10-30 ENCOUNTER — Telehealth (HOSPITAL_BASED_OUTPATIENT_CLINIC_OR_DEPARTMENT_OTHER): Payer: Self-pay | Admitting: Obstetrics & Gynecology

## 2024-10-30 DIAGNOSIS — R3915 Urgency of urination: Secondary | ICD-10-CM

## 2024-10-30 DIAGNOSIS — R350 Frequency of micturition: Secondary | ICD-10-CM

## 2024-10-30 MED ORDER — SULFAMETHOXAZOLE-TRIMETHOPRIM 800-160 MG PO TABS: 1.0000 | ORAL_TABLET | Freq: Two times a day (BID) | ORAL | 0 refills | Status: AC

## 2024-10-30 NOTE — Telephone Encounter (Signed)
 Pt called on call provider.  Returned call x 2.  Went directly to [employer].  Left brief message and sent mychart message.  H/o UTI but still with symptoms per message from answering service.  E coli on culture that was sensitive to nitrofurantoin .  Will switch abx to bactrim  DS BID x 3 days and advised to follow up on Monday if symptoms still persist.  Also advised to go to Urgent can with any upper back pain, hematuria or fever.  Advised to call back through answering service if had more questions.

## 2024-11-01 NOTE — Telephone Encounter (Signed)
 Thank you :)

## 2024-11-15 NOTE — Telephone Encounter (Signed)
 Spoke with patient. Seen in office 10/22/24, treated for UTI, symptoms did not resolve, treated again on 10/30/24 with Bactrim  DS BID x 3 days.   Patient reports symptoms resolved. Symptoms are back. Patient reports symptoms of urinary frequency, urgency, odor and not completely emptying her bladder. Denies fever/chills, flank pain, blood in urine.   Lab appt scheduled for 12/2 at 0850. Advised I will review with Tiffany when she returns on 11/16/24, if OV needed, I will return call. Patient agreeable.   Tiffany -please review and advise.

## 2024-11-16 ENCOUNTER — Ambulatory Visit

## 2024-11-16 ENCOUNTER — Ambulatory Visit: Payer: Self-pay | Admitting: Nurse Practitioner

## 2024-11-16 ENCOUNTER — Telehealth: Payer: Self-pay | Admitting: Nurse Practitioner

## 2024-11-16 DIAGNOSIS — R3915 Urgency of urination: Secondary | ICD-10-CM

## 2024-11-16 DIAGNOSIS — R350 Frequency of micturition: Secondary | ICD-10-CM

## 2024-11-16 DIAGNOSIS — N3000 Acute cystitis without hematuria: Secondary | ICD-10-CM

## 2024-11-16 MED ORDER — CIPROFLOXACIN HCL 500 MG PO TABS: 500.0000 mg | ORAL_TABLET | Freq: Two times a day (BID) | ORAL | 0 refills | Status: AC

## 2024-11-16 NOTE — Telephone Encounter (Signed)
 Reviewed with Tiffany, ok to proceed as scheduled. Patient aware she will be contacted with results.   Routing to provider for final review. Patient is agreeable to disposition. Will close encounter.

## 2024-11-16 NOTE — Telephone Encounter (Signed)
 Call attempted to discuss UA results and POC. No answer.

## 2024-11-16 NOTE — Telephone Encounter (Signed)
 Spoke to patient regarding UA results - start Cipro BID x 7 days. Culture pending. D-mannose, probiotics recommended. Will considering daily antibiotic for prevention.

## 2024-11-19 ENCOUNTER — Encounter: Payer: Self-pay | Admitting: Nurse Practitioner

## 2024-11-19 LAB — URINALYSIS, ROUTINE W REFLEX MICROSCOPIC
Bilirubin Urine: NEGATIVE
Glucose, UA: NEGATIVE
Hyaline Cast: NONE SEEN /LPF
Ketones, ur: NEGATIVE
Nitrite: POSITIVE — AB
Specific Gravity, Urine: 1.015 (ref 1.001–1.035)
pH: 7.5 (ref 5.0–8.0)

## 2024-11-19 LAB — URINE CULTURE
MICRO NUMBER:: 17302282
SPECIMEN QUALITY:: ADEQUATE

## 2024-11-19 LAB — NOTE

## 2024-11-19 NOTE — Telephone Encounter (Signed)
 Preliminary report in EPIC, urine culture 11/16/24  Routing to Tiffany to advise.

## 2024-11-22 ENCOUNTER — Telehealth: Payer: Self-pay

## 2024-11-22 NOTE — Telephone Encounter (Signed)
 Patient called asking if she needs an appointment with provider for follow up UA or if she just needs to come in for lab visit. Please advise.

## 2024-11-23 ENCOUNTER — Other Ambulatory Visit: Payer: Self-pay | Admitting: Nurse Practitioner

## 2024-11-23 DIAGNOSIS — N3000 Acute cystitis without hematuria: Secondary | ICD-10-CM

## 2024-11-23 NOTE — Telephone Encounter (Signed)
 UA only is fine.

## 2024-11-26 ENCOUNTER — Other Ambulatory Visit (INDEPENDENT_AMBULATORY_CARE_PROVIDER_SITE_OTHER)

## 2024-11-26 DIAGNOSIS — N3000 Acute cystitis without hematuria: Secondary | ICD-10-CM

## 2024-11-26 LAB — URINALYSIS, COMPLETE W/RFL CULTURE
Bacteria, UA: NONE SEEN /HPF
Bilirubin Urine: NEGATIVE
Glucose, UA: NEGATIVE
Hgb urine dipstick: NEGATIVE
Hyaline Cast: NONE SEEN /LPF
Ketones, ur: NEGATIVE
Leukocyte Esterase: NEGATIVE
Nitrites, Initial: NEGATIVE
Protein, ur: NEGATIVE
RBC / HPF: NONE SEEN /HPF (ref 0–2)
Specific Gravity, Urine: 1.02 (ref 1.001–1.035)
WBC, UA: NONE SEEN /HPF (ref 0–5)
pH: 7 (ref 5.0–8.0)

## 2024-11-26 LAB — NO CULTURE INDICATED

## 2024-11-28 ENCOUNTER — Ambulatory Visit: Payer: Self-pay | Admitting: Nurse Practitioner

## 2024-11-29 NOTE — Telephone Encounter (Signed)
 No OV needed. Thanks.

## 2024-12-03 ENCOUNTER — Ambulatory Visit: Admitting: Nurse Practitioner

## 2025-06-16 ENCOUNTER — Ambulatory Visit: Admitting: Nurse Practitioner
